# Patient Record
Sex: Female | Born: 1970 | Race: White | Hispanic: No | Marital: Married | Smoking: Former smoker
Health system: Southern US, Community
[De-identification: ages and names within clinical notes are randomized; demographics above are authoritative.]

## PROBLEM LIST (undated history)

## (undated) DIAGNOSIS — K219 Gastro-esophageal reflux disease without esophagitis: Secondary | ICD-10-CM

## (undated) DIAGNOSIS — N8500 Endometrial hyperplasia, unspecified: Secondary | ICD-10-CM

## (undated) DIAGNOSIS — F410 Panic disorder [episodic paroxysmal anxiety] without agoraphobia: Secondary | ICD-10-CM

## (undated) DIAGNOSIS — J189 Pneumonia, unspecified organism: Secondary | ICD-10-CM

## (undated) DIAGNOSIS — R0602 Shortness of breath: Secondary | ICD-10-CM

## (undated) DIAGNOSIS — K432 Incisional hernia without obstruction or gangrene: Secondary | ICD-10-CM

## (undated) HISTORY — DX: Morbid (severe) obesity due to excess calories: E66.01

## (undated) HISTORY — DX: Panic disorder (episodic paroxysmal anxiety): F41.0

## (undated) HISTORY — PX: OOPHORECTOMY: SHX86

## (undated) HISTORY — PX: LAPAROSCOPIC CHOLECYSTECTOMY: SUR755

## (undated) HISTORY — PX: HERNIA REPAIR: SHX51

## (undated) HISTORY — DX: Endometrial hyperplasia, unspecified: N85.00

---
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
## 1997-07-20 ENCOUNTER — Encounter: Admission: RE | Admit: 1997-07-20 | Discharge: 1997-07-20 | Payer: Self-pay | Admitting: Family Medicine

## 1997-08-11 ENCOUNTER — Emergency Department (HOSPITAL_COMMUNITY): Admission: EM | Admit: 1997-08-11 | Discharge: 1997-08-11 | Payer: Self-pay | Admitting: Emergency Medicine

## 1997-08-15 ENCOUNTER — Emergency Department (HOSPITAL_COMMUNITY): Admission: EM | Admit: 1997-08-15 | Discharge: 1997-08-15 | Payer: Self-pay | Admitting: Emergency Medicine

## 1997-09-11 ENCOUNTER — Emergency Department (HOSPITAL_COMMUNITY): Admission: EM | Admit: 1997-09-11 | Discharge: 1997-09-11 | Payer: Self-pay | Admitting: Emergency Medicine

## 1997-09-17 ENCOUNTER — Encounter: Admission: RE | Admit: 1997-09-17 | Discharge: 1997-09-17 | Payer: Self-pay | Admitting: Family Medicine

## 1997-10-01 ENCOUNTER — Encounter: Admission: RE | Admit: 1997-10-01 | Discharge: 1997-10-01 | Payer: Self-pay | Admitting: Sports Medicine

## 1998-05-27 ENCOUNTER — Emergency Department (HOSPITAL_COMMUNITY): Admission: EM | Admit: 1998-05-27 | Discharge: 1998-05-27 | Payer: Self-pay | Admitting: Emergency Medicine

## 1998-05-28 ENCOUNTER — Encounter: Payer: Self-pay | Admitting: Emergency Medicine

## 1998-06-22 ENCOUNTER — Encounter: Payer: Self-pay | Admitting: Emergency Medicine

## 1998-06-22 ENCOUNTER — Encounter: Payer: Self-pay | Admitting: General Surgery

## 1998-06-22 ENCOUNTER — Inpatient Hospital Stay (HOSPITAL_COMMUNITY): Admission: EM | Admit: 1998-06-22 | Discharge: 1998-06-23 | Payer: Self-pay | Admitting: Emergency Medicine

## 1998-09-09 ENCOUNTER — Emergency Department (HOSPITAL_COMMUNITY): Admission: EM | Admit: 1998-09-09 | Discharge: 1998-09-09 | Payer: Self-pay | Admitting: Emergency Medicine

## 1999-03-17 ENCOUNTER — Emergency Department (HOSPITAL_COMMUNITY): Admission: EM | Admit: 1999-03-17 | Discharge: 1999-03-17 | Payer: Self-pay | Admitting: Emergency Medicine

## 1999-06-12 ENCOUNTER — Emergency Department (HOSPITAL_COMMUNITY): Admission: EM | Admit: 1999-06-12 | Discharge: 1999-06-12 | Payer: Self-pay | Admitting: Emergency Medicine

## 1999-06-16 ENCOUNTER — Other Ambulatory Visit: Admission: RE | Admit: 1999-06-16 | Discharge: 1999-06-16 | Payer: Self-pay | Admitting: Internal Medicine

## 2000-01-27 ENCOUNTER — Other Ambulatory Visit: Admission: RE | Admit: 2000-01-27 | Discharge: 2000-01-27 | Payer: Self-pay | Admitting: Obstetrics & Gynecology

## 2000-07-31 ENCOUNTER — Emergency Department (HOSPITAL_COMMUNITY): Admission: EM | Admit: 2000-07-31 | Discharge: 2000-07-31 | Payer: Self-pay | Admitting: Emergency Medicine

## 2000-09-07 ENCOUNTER — Emergency Department (HOSPITAL_COMMUNITY): Admission: EM | Admit: 2000-09-07 | Discharge: 2000-09-07 | Payer: Self-pay

## 2000-09-17 ENCOUNTER — Encounter: Admission: RE | Admit: 2000-09-17 | Discharge: 2000-09-17 | Payer: Self-pay | Admitting: Family Medicine

## 2000-10-05 ENCOUNTER — Encounter: Admission: RE | Admit: 2000-10-05 | Discharge: 2000-10-05 | Payer: Self-pay | Admitting: Family Medicine

## 2000-10-15 ENCOUNTER — Encounter (INDEPENDENT_AMBULATORY_CARE_PROVIDER_SITE_OTHER): Payer: Self-pay | Admitting: *Deleted

## 2000-10-19 ENCOUNTER — Encounter: Admission: RE | Admit: 2000-10-19 | Discharge: 2000-10-19 | Payer: Self-pay | Admitting: Family Medicine

## 2000-11-10 ENCOUNTER — Emergency Department (HOSPITAL_COMMUNITY): Admission: EM | Admit: 2000-11-10 | Discharge: 2000-11-10 | Payer: Self-pay | Admitting: Emergency Medicine

## 2000-11-19 ENCOUNTER — Encounter: Admission: RE | Admit: 2000-11-19 | Discharge: 2000-11-19 | Payer: Self-pay | Admitting: Family Medicine

## 2000-11-24 ENCOUNTER — Emergency Department (HOSPITAL_COMMUNITY): Admission: EM | Admit: 2000-11-24 | Discharge: 2000-11-24 | Payer: Self-pay | Admitting: Emergency Medicine

## 2000-11-24 ENCOUNTER — Encounter: Payer: Self-pay | Admitting: Emergency Medicine

## 2000-12-24 ENCOUNTER — Encounter: Admission: RE | Admit: 2000-12-24 | Discharge: 2000-12-24 | Payer: Self-pay | Admitting: Family Medicine

## 2000-12-31 ENCOUNTER — Ambulatory Visit (HOSPITAL_COMMUNITY): Admission: RE | Admit: 2000-12-31 | Discharge: 2000-12-31 | Payer: Self-pay

## 2001-01-07 ENCOUNTER — Encounter: Admission: RE | Admit: 2001-01-07 | Discharge: 2001-01-07 | Payer: Self-pay | Admitting: Family Medicine

## 2001-01-23 ENCOUNTER — Encounter: Admission: RE | Admit: 2001-01-23 | Discharge: 2001-01-23 | Payer: Self-pay | Admitting: Family Medicine

## 2001-02-01 ENCOUNTER — Ambulatory Visit (HOSPITAL_COMMUNITY): Admission: RE | Admit: 2001-02-01 | Discharge: 2001-02-01 | Payer: Self-pay | Admitting: Obstetrics

## 2001-02-06 ENCOUNTER — Encounter: Admission: RE | Admit: 2001-02-06 | Discharge: 2001-02-06 | Payer: Self-pay | Admitting: Family Medicine

## 2001-02-11 ENCOUNTER — Encounter: Admission: RE | Admit: 2001-02-11 | Discharge: 2001-02-11 | Payer: Self-pay | Admitting: Family Medicine

## 2001-02-21 ENCOUNTER — Encounter: Admission: RE | Admit: 2001-02-21 | Discharge: 2001-02-21 | Payer: Self-pay | Admitting: Family Medicine

## 2001-02-25 ENCOUNTER — Encounter: Admission: RE | Admit: 2001-02-25 | Discharge: 2001-02-25 | Payer: Self-pay | Admitting: Family Medicine

## 2001-03-02 ENCOUNTER — Emergency Department (HOSPITAL_COMMUNITY): Admission: EM | Admit: 2001-03-02 | Discharge: 2001-03-02 | Payer: Self-pay

## 2001-03-20 ENCOUNTER — Encounter: Admission: RE | Admit: 2001-03-20 | Discharge: 2001-03-20 | Payer: Self-pay | Admitting: Family Medicine

## 2001-03-27 ENCOUNTER — Inpatient Hospital Stay (HOSPITAL_COMMUNITY): Admission: AD | Admit: 2001-03-27 | Discharge: 2001-03-27 | Payer: Self-pay | Admitting: *Deleted

## 2001-03-27 ENCOUNTER — Encounter: Admission: RE | Admit: 2001-03-27 | Discharge: 2001-03-27 | Payer: Self-pay | Admitting: Family Medicine

## 2001-04-01 ENCOUNTER — Encounter: Admission: RE | Admit: 2001-04-01 | Discharge: 2001-04-01 | Payer: Self-pay | Admitting: Family Medicine

## 2001-04-08 ENCOUNTER — Encounter: Admission: RE | Admit: 2001-04-08 | Discharge: 2001-04-08 | Payer: Self-pay | Admitting: Family Medicine

## 2001-04-12 ENCOUNTER — Ambulatory Visit (HOSPITAL_COMMUNITY): Admission: RE | Admit: 2001-04-12 | Discharge: 2001-04-12 | Payer: Self-pay

## 2001-04-17 HISTORY — PX: LAPAROSCOPIC TUBAL LIGATION: SHX1937

## 2001-04-19 ENCOUNTER — Encounter: Admission: RE | Admit: 2001-04-19 | Discharge: 2001-04-19 | Payer: Self-pay | Admitting: Family Medicine

## 2001-04-19 ENCOUNTER — Inpatient Hospital Stay: Admission: AD | Admit: 2001-04-19 | Discharge: 2001-04-19 | Payer: Self-pay | Admitting: Obstetrics

## 2001-04-21 ENCOUNTER — Inpatient Hospital Stay (HOSPITAL_COMMUNITY): Admission: AD | Admit: 2001-04-21 | Discharge: 2001-04-21 | Payer: Self-pay | Admitting: Obstetrics & Gynecology

## 2001-04-28 ENCOUNTER — Inpatient Hospital Stay (HOSPITAL_COMMUNITY): Admission: AD | Admit: 2001-04-28 | Discharge: 2001-04-28 | Payer: Self-pay | Admitting: Obstetrics & Gynecology

## 2001-04-29 ENCOUNTER — Encounter: Admission: RE | Admit: 2001-04-29 | Discharge: 2001-04-29 | Payer: Self-pay | Admitting: Family Medicine

## 2001-05-07 ENCOUNTER — Inpatient Hospital Stay (HOSPITAL_COMMUNITY): Admission: AD | Admit: 2001-05-07 | Discharge: 2001-05-07 | Payer: Self-pay | Admitting: *Deleted

## 2001-05-13 ENCOUNTER — Inpatient Hospital Stay (HOSPITAL_COMMUNITY): Admission: AD | Admit: 2001-05-13 | Discharge: 2001-05-17 | Payer: Self-pay | Admitting: Obstetrics & Gynecology

## 2001-05-13 ENCOUNTER — Encounter (INDEPENDENT_AMBULATORY_CARE_PROVIDER_SITE_OTHER): Payer: Self-pay | Admitting: Specialist

## 2001-05-13 ENCOUNTER — Inpatient Hospital Stay (HOSPITAL_COMMUNITY): Admission: AD | Admit: 2001-05-13 | Discharge: 2001-05-13 | Payer: Self-pay | Admitting: Obstetrics & Gynecology

## 2001-05-21 ENCOUNTER — Inpatient Hospital Stay (HOSPITAL_COMMUNITY): Admission: AD | Admit: 2001-05-21 | Discharge: 2001-05-21 | Payer: Self-pay | Admitting: *Deleted

## 2001-06-12 ENCOUNTER — Encounter: Admission: RE | Admit: 2001-06-12 | Discharge: 2001-06-12 | Payer: Self-pay | Admitting: Family Medicine

## 2001-09-13 ENCOUNTER — Ambulatory Visit (HOSPITAL_COMMUNITY): Admission: RE | Admit: 2001-09-13 | Discharge: 2001-09-13 | Payer: Self-pay | Admitting: Obstetrics & Gynecology

## 2001-11-11 ENCOUNTER — Encounter: Admission: RE | Admit: 2001-11-11 | Discharge: 2001-11-11 | Payer: Self-pay | Admitting: Family Medicine

## 2002-02-27 ENCOUNTER — Encounter: Admission: RE | Admit: 2002-02-27 | Discharge: 2002-02-27 | Payer: Self-pay | Admitting: Family Medicine

## 2002-03-03 ENCOUNTER — Encounter: Admission: RE | Admit: 2002-03-03 | Discharge: 2002-03-03 | Payer: Self-pay | Admitting: Family Medicine

## 2002-03-06 ENCOUNTER — Encounter: Admission: RE | Admit: 2002-03-06 | Discharge: 2002-03-06 | Payer: Self-pay | Admitting: Family Medicine

## 2002-03-17 ENCOUNTER — Encounter: Admission: RE | Admit: 2002-03-17 | Discharge: 2002-03-17 | Payer: Self-pay | Admitting: Family Medicine

## 2002-03-31 ENCOUNTER — Encounter: Admission: RE | Admit: 2002-03-31 | Discharge: 2002-03-31 | Payer: Self-pay | Admitting: Family Medicine

## 2002-05-01 ENCOUNTER — Encounter: Admission: RE | Admit: 2002-05-01 | Discharge: 2002-05-01 | Payer: Self-pay | Admitting: Family Medicine

## 2002-07-04 ENCOUNTER — Encounter: Admission: RE | Admit: 2002-07-04 | Discharge: 2002-07-04 | Payer: Self-pay | Admitting: Family Medicine

## 2002-09-19 ENCOUNTER — Encounter: Admission: RE | Admit: 2002-09-19 | Discharge: 2002-09-19 | Payer: Self-pay | Admitting: Family Medicine

## 2002-11-06 ENCOUNTER — Encounter: Admission: RE | Admit: 2002-11-06 | Discharge: 2002-11-06 | Payer: Self-pay | Admitting: Family Medicine

## 2002-11-28 ENCOUNTER — Encounter: Admission: RE | Admit: 2002-11-28 | Discharge: 2002-11-28 | Payer: Self-pay | Admitting: Family Medicine

## 2002-12-18 ENCOUNTER — Encounter: Admission: RE | Admit: 2002-12-18 | Discharge: 2002-12-18 | Payer: Self-pay | Admitting: Family Medicine

## 2003-01-24 ENCOUNTER — Emergency Department (HOSPITAL_COMMUNITY): Admission: EM | Admit: 2003-01-24 | Discharge: 2003-01-24 | Payer: Self-pay | Admitting: Emergency Medicine

## 2003-02-16 ENCOUNTER — Emergency Department (HOSPITAL_COMMUNITY): Admission: AD | Admit: 2003-02-16 | Discharge: 2003-02-16 | Payer: Self-pay | Admitting: Family Medicine

## 2003-05-01 ENCOUNTER — Encounter: Admission: RE | Admit: 2003-05-01 | Discharge: 2003-05-01 | Payer: Self-pay | Admitting: Family Medicine

## 2003-05-07 ENCOUNTER — Encounter: Admission: RE | Admit: 2003-05-07 | Discharge: 2003-05-07 | Payer: Self-pay | Admitting: Family Medicine

## 2003-07-21 ENCOUNTER — Encounter: Admission: RE | Admit: 2003-07-21 | Discharge: 2003-07-21 | Payer: Self-pay | Admitting: Family Medicine

## 2003-08-15 ENCOUNTER — Inpatient Hospital Stay (HOSPITAL_COMMUNITY): Admission: AD | Admit: 2003-08-15 | Discharge: 2003-08-15 | Payer: Self-pay | Admitting: Family Medicine

## 2003-08-17 ENCOUNTER — Encounter: Admission: RE | Admit: 2003-08-17 | Discharge: 2003-08-17 | Payer: Self-pay | Admitting: Family Medicine

## 2003-08-21 ENCOUNTER — Emergency Department (HOSPITAL_COMMUNITY): Admission: EM | Admit: 2003-08-21 | Discharge: 2003-08-21 | Payer: Self-pay | Admitting: Emergency Medicine

## 2003-08-24 ENCOUNTER — Encounter: Admission: RE | Admit: 2003-08-24 | Discharge: 2003-08-24 | Payer: Self-pay | Admitting: Family Medicine

## 2003-08-25 ENCOUNTER — Encounter: Admission: RE | Admit: 2003-08-25 | Discharge: 2003-08-25 | Payer: Self-pay | Admitting: Family Medicine

## 2003-09-03 ENCOUNTER — Encounter: Admission: RE | Admit: 2003-09-03 | Discharge: 2003-09-03 | Payer: Self-pay | Admitting: Family Medicine

## 2003-09-08 ENCOUNTER — Encounter: Admission: RE | Admit: 2003-09-08 | Discharge: 2003-09-25 | Payer: Self-pay | Admitting: Family Medicine

## 2003-11-24 ENCOUNTER — Emergency Department (HOSPITAL_COMMUNITY): Admission: EM | Admit: 2003-11-24 | Discharge: 2003-11-24 | Payer: Self-pay | Admitting: Emergency Medicine

## 2004-01-25 ENCOUNTER — Ambulatory Visit: Payer: Self-pay | Admitting: Family Medicine

## 2004-03-14 ENCOUNTER — Ambulatory Visit: Payer: Self-pay | Admitting: Family Medicine

## 2004-04-25 ENCOUNTER — Ambulatory Visit (HOSPITAL_COMMUNITY): Admission: RE | Admit: 2004-04-25 | Discharge: 2004-04-25 | Payer: Self-pay | Admitting: Family Medicine

## 2004-04-25 ENCOUNTER — Emergency Department (HOSPITAL_COMMUNITY): Admission: EM | Admit: 2004-04-25 | Discharge: 2004-04-25 | Payer: Self-pay | Admitting: Family Medicine

## 2004-05-05 ENCOUNTER — Ambulatory Visit: Payer: Self-pay | Admitting: Family Medicine

## 2004-11-14 ENCOUNTER — Ambulatory Visit (HOSPITAL_COMMUNITY): Admission: RE | Admit: 2004-11-14 | Discharge: 2004-11-14 | Payer: Self-pay | Admitting: Family Medicine

## 2004-11-14 ENCOUNTER — Ambulatory Visit: Payer: Self-pay | Admitting: Family Medicine

## 2004-12-12 ENCOUNTER — Ambulatory Visit: Payer: Self-pay | Admitting: Family Medicine

## 2005-02-10 ENCOUNTER — Ambulatory Visit: Payer: Self-pay | Admitting: Family Medicine

## 2005-02-16 ENCOUNTER — Ambulatory Visit: Payer: Self-pay | Admitting: Family Medicine

## 2005-02-28 ENCOUNTER — Ambulatory Visit: Payer: Self-pay | Admitting: Family Medicine

## 2005-03-16 ENCOUNTER — Ambulatory Visit (HOSPITAL_COMMUNITY): Admission: RE | Admit: 2005-03-16 | Discharge: 2005-03-16 | Payer: Self-pay | Admitting: Family Medicine

## 2005-04-03 ENCOUNTER — Ambulatory Visit: Payer: Self-pay | Admitting: Family Medicine

## 2005-08-31 ENCOUNTER — Ambulatory Visit: Payer: Self-pay | Admitting: Family Medicine

## 2005-11-13 ENCOUNTER — Ambulatory Visit: Payer: Self-pay | Admitting: Family Medicine

## 2005-11-14 ENCOUNTER — Ambulatory Visit: Payer: Self-pay | Admitting: Family Medicine

## 2005-11-14 ENCOUNTER — Encounter: Admission: RE | Admit: 2005-11-14 | Discharge: 2005-11-14 | Payer: Self-pay | Admitting: Sports Medicine

## 2005-11-20 ENCOUNTER — Emergency Department (HOSPITAL_COMMUNITY): Admission: EM | Admit: 2005-11-20 | Discharge: 2005-11-20 | Payer: Self-pay | Admitting: Family Medicine

## 2005-12-04 ENCOUNTER — Ambulatory Visit: Payer: Self-pay | Admitting: Family Medicine

## 2006-02-28 ENCOUNTER — Emergency Department (HOSPITAL_COMMUNITY): Admission: EM | Admit: 2006-02-28 | Discharge: 2006-02-28 | Payer: Self-pay | Admitting: Emergency Medicine

## 2006-03-22 ENCOUNTER — Ambulatory Visit: Payer: Self-pay | Admitting: Family Medicine

## 2006-06-14 DIAGNOSIS — F410 Panic disorder [episodic paroxysmal anxiety] without agoraphobia: Secondary | ICD-10-CM | POA: Insufficient documentation

## 2006-06-14 DIAGNOSIS — E669 Obesity, unspecified: Secondary | ICD-10-CM

## 2006-06-14 DIAGNOSIS — E66813 Obesity, class 3: Secondary | ICD-10-CM

## 2006-06-14 HISTORY — DX: Panic disorder (episodic paroxysmal anxiety): F41.0

## 2006-06-14 HISTORY — DX: Morbid (severe) obesity due to excess calories: E66.01

## 2006-06-14 HISTORY — DX: Obesity, class 3: E66.813

## 2006-06-15 ENCOUNTER — Encounter (INDEPENDENT_AMBULATORY_CARE_PROVIDER_SITE_OTHER): Payer: Self-pay | Admitting: *Deleted

## 2006-08-29 ENCOUNTER — Emergency Department (HOSPITAL_COMMUNITY): Admission: EM | Admit: 2006-08-29 | Discharge: 2006-08-29 | Payer: Self-pay | Admitting: Emergency Medicine

## 2007-02-25 ENCOUNTER — Encounter: Payer: Self-pay | Admitting: Family Medicine

## 2007-03-11 ENCOUNTER — Ambulatory Visit: Payer: Self-pay | Admitting: Family Medicine

## 2007-03-11 ENCOUNTER — Encounter: Payer: Self-pay | Admitting: Family Medicine

## 2007-03-11 LAB — CONVERTED CEMR LAB
AFP-Tumor Marker: 2.5 ng/mL (ref 0.0–8.0)
ALT: 15 units/L (ref 0–35)
Alkaline Phosphatase: 66 units/L (ref 39–117)
Basophils Absolute: 0 10*3/uL (ref 0.0–0.1)
Basophils Relative: 0 % (ref 0–1)
CO2: 22 meq/L (ref 19–32)
Creatinine, Ser: 0.77 mg/dL (ref 0.40–1.20)
Eosinophils Absolute: 0.2 10*3/uL (ref 0.2–0.7)
Eosinophils Relative: 2 % (ref 0–5)
HCT: 31.8 % — ABNORMAL LOW (ref 36.0–46.0)
Hemoglobin: 9 g/dL — ABNORMAL LOW (ref 12.0–15.0)
LDH: 196 units/L (ref 94–250)
Lymphocytes Relative: 16 % (ref 12–46)
MCHC: 28.3 g/dL — ABNORMAL LOW (ref 30.0–36.0)
MCV: 85 fL (ref 78.0–100.0)
Monocytes Absolute: 0.6 10*3/uL (ref 0.1–1.0)
Platelets: 455 10*3/uL — ABNORMAL HIGH (ref 150–400)
RDW: 15.4 % (ref 11.5–15.5)
Total Bilirubin: 0.4 mg/dL (ref 0.3–1.2)

## 2007-03-12 ENCOUNTER — Ambulatory Visit (HOSPITAL_COMMUNITY): Admission: RE | Admit: 2007-03-12 | Discharge: 2007-03-12 | Payer: Self-pay | Admitting: Family Medicine

## 2007-03-12 ENCOUNTER — Telehealth (INDEPENDENT_AMBULATORY_CARE_PROVIDER_SITE_OTHER): Payer: Self-pay | Admitting: *Deleted

## 2007-03-15 ENCOUNTER — Ambulatory Visit: Admission: RE | Admit: 2007-03-15 | Discharge: 2007-03-15 | Payer: Self-pay | Admitting: Gynecology

## 2007-04-18 HISTORY — PX: ABDOMINAL HYSTERECTOMY: SHX81

## 2007-04-23 ENCOUNTER — Inpatient Hospital Stay (HOSPITAL_COMMUNITY): Admission: RE | Admit: 2007-04-23 | Discharge: 2007-04-26 | Payer: Self-pay | Admitting: Obstetrics & Gynecology

## 2007-04-23 ENCOUNTER — Encounter: Payer: Self-pay | Admitting: Gynecology

## 2007-04-23 ENCOUNTER — Encounter: Payer: Self-pay | Admitting: Family Medicine

## 2007-04-23 DIAGNOSIS — N8500 Endometrial hyperplasia, unspecified: Secondary | ICD-10-CM

## 2007-04-23 HISTORY — DX: Endometrial hyperplasia, unspecified: N85.00

## 2007-04-26 ENCOUNTER — Encounter: Payer: Self-pay | Admitting: Family Medicine

## 2007-05-28 ENCOUNTER — Encounter: Payer: Self-pay | Admitting: Pulmonary Disease

## 2008-02-19 IMAGING — US US EXTREM LOW VENOUS*L*
1 series · 14 of 24 positions shown · non-contrast
Comparison: None.

CLINICAL DATA: 35 year old with left lower leg pain and swelling.  
 LEFT LOWER EXTREMITY VENOUS DOPPLER EXAMINATION:
TECHNIQUE: Gray-scale sonography with compression, as well as color and duplex Doppler ultrasound, were performed to evaluate the deep venous system from the level of the common femoral vein through the popliteal and proximal calf veins.

[Series 1: unknown · 14 of 29 slices shown]
[im 1/29]
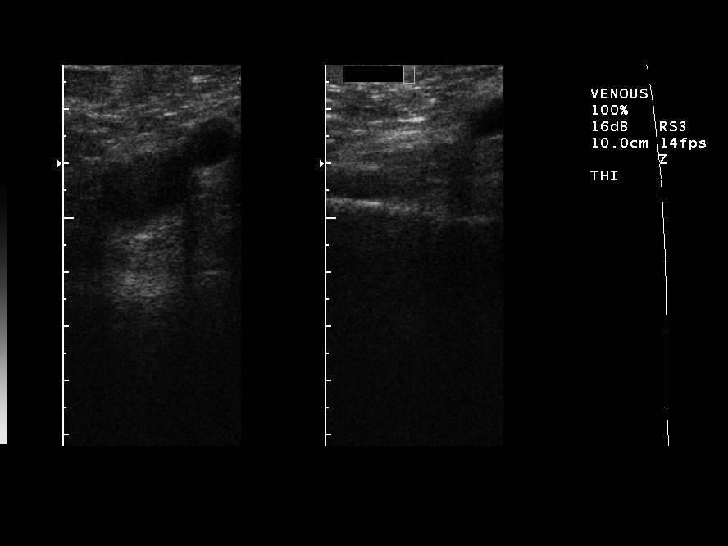
[im 3/29]
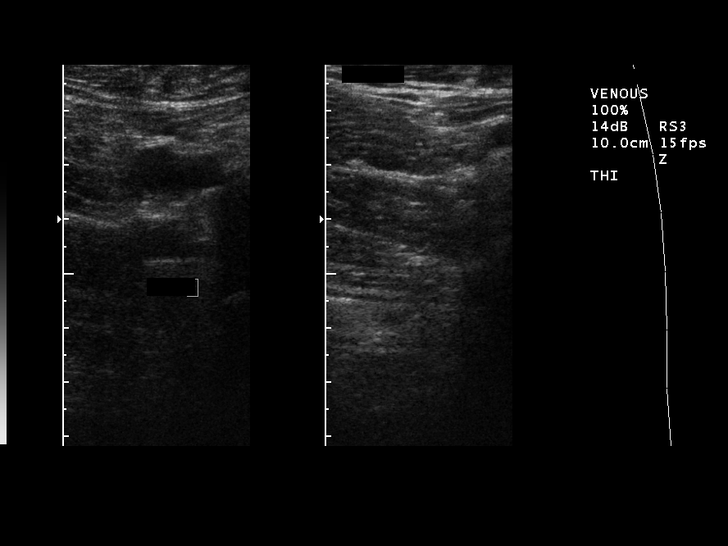
[im 5/29]
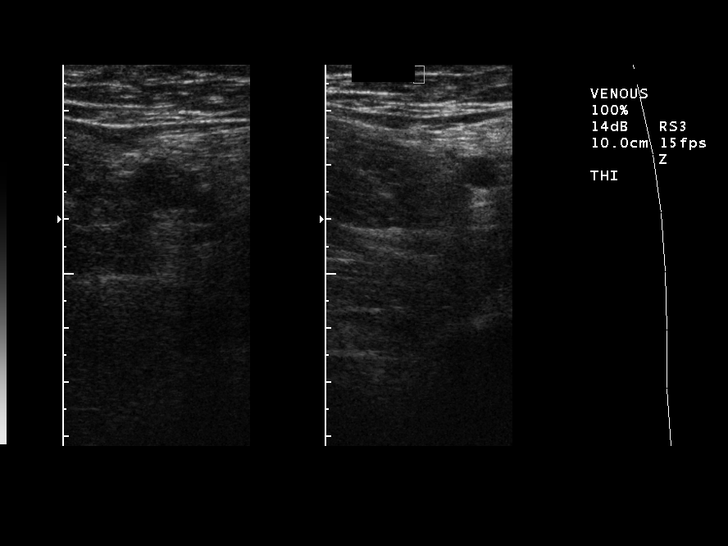
[im 8/29]
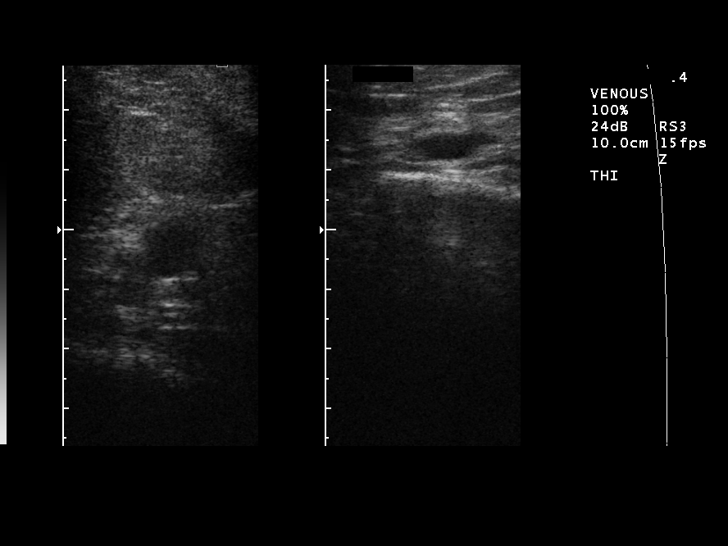
[im 9/29]
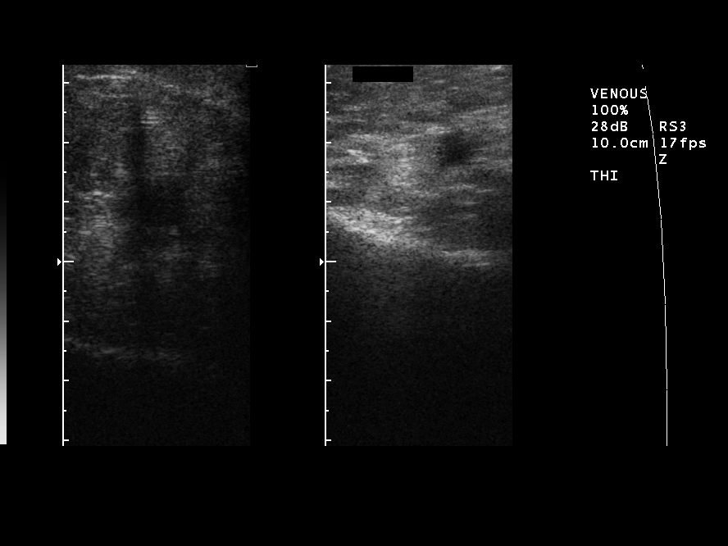
[im 11/29]
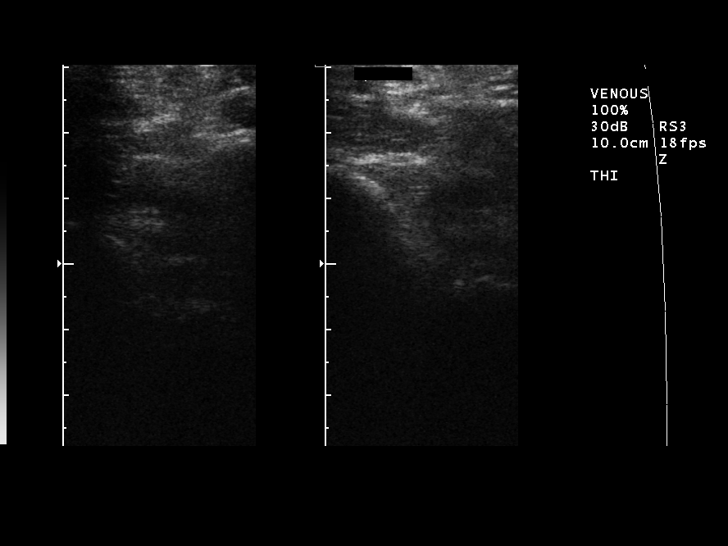
[im 14/29]
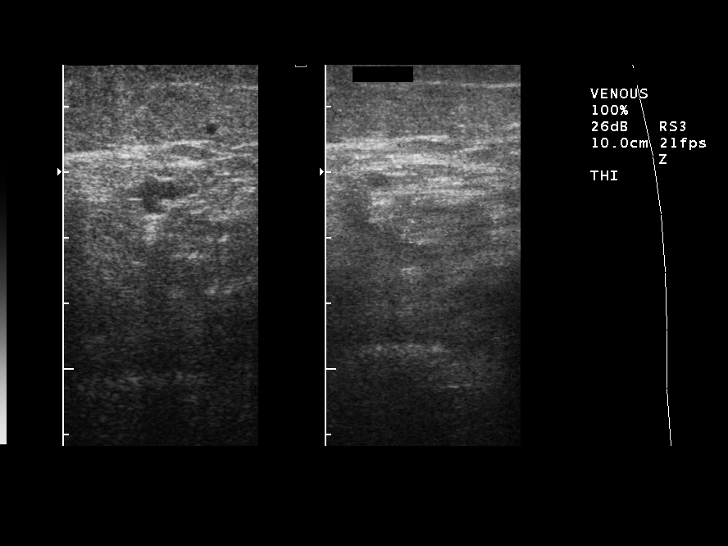
[im 15/29]
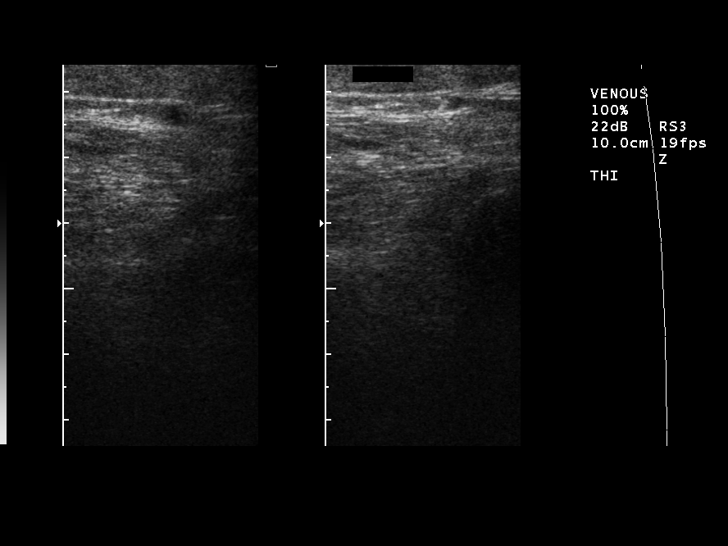
[im 18/29]
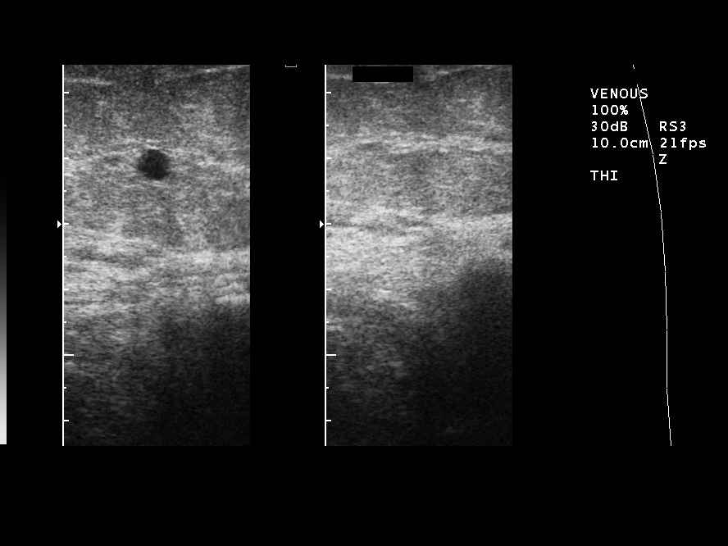
[im 20/29]
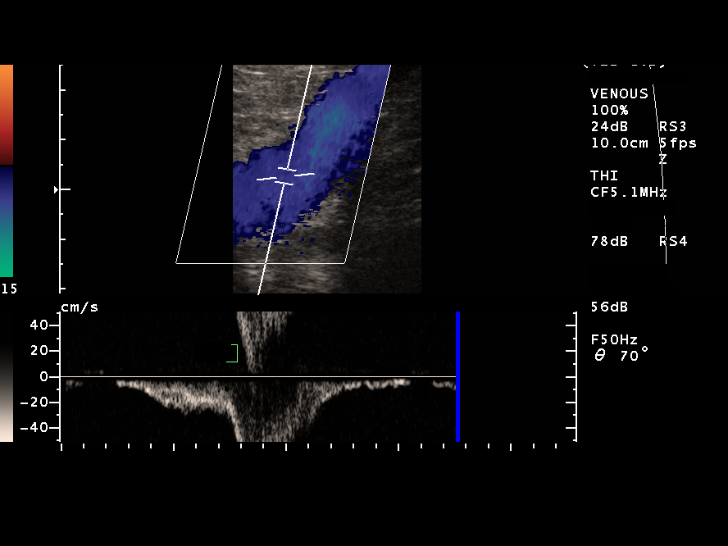
[im 22/29]
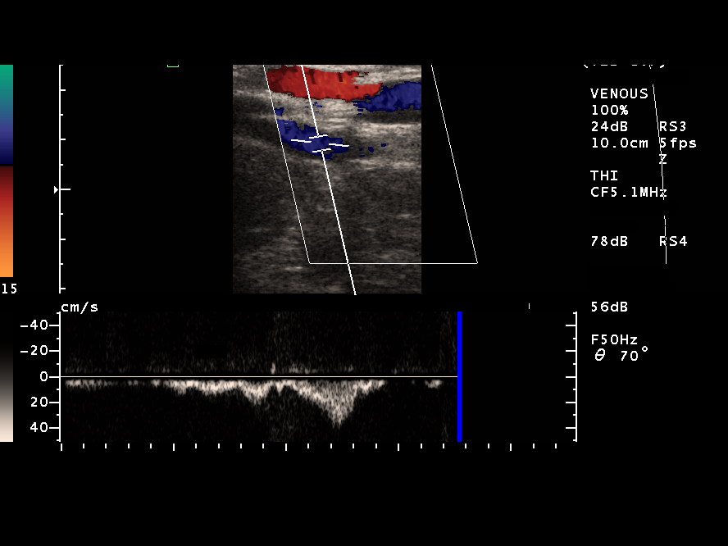
[im 24/29]
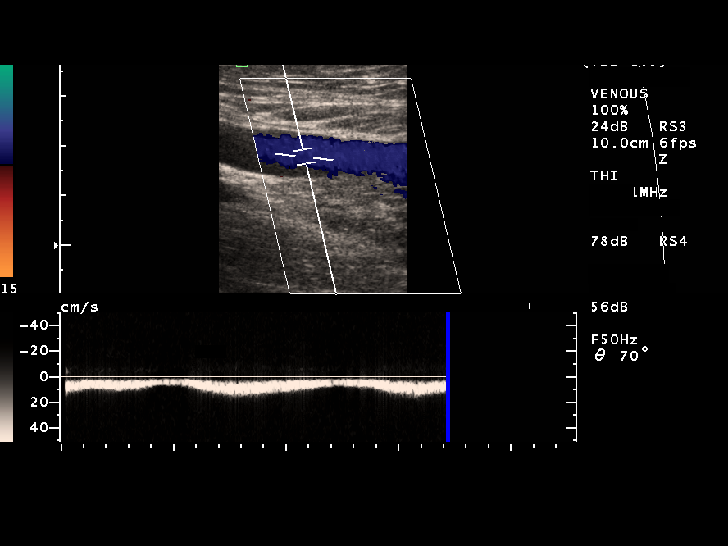
[im 26/29]
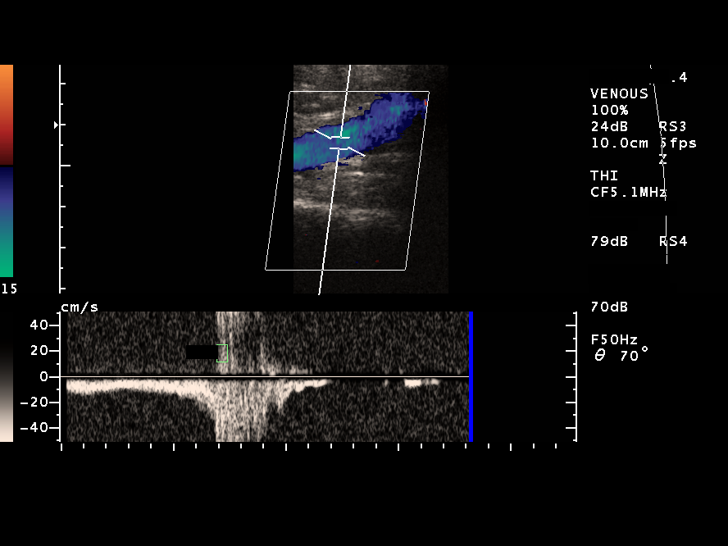
[im 29/29]
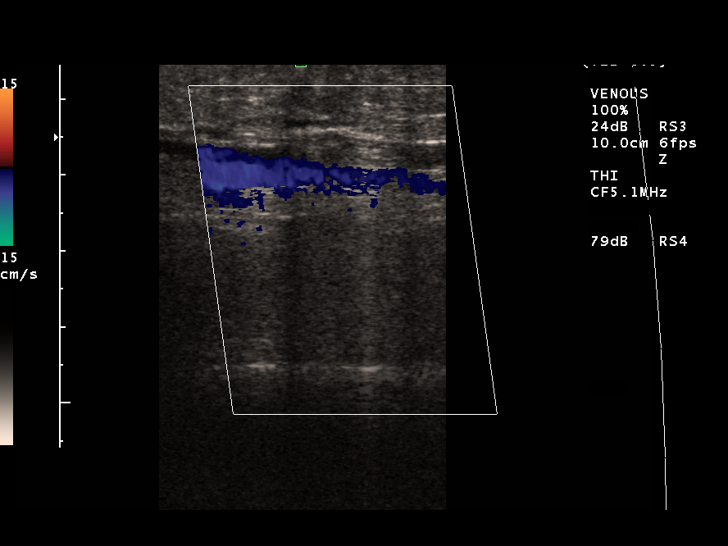

[14 of 24 positions shown; findings below may reference images not displayed]

FINDINGS: There is patent flow in the common femoral, superficial femoral, and popliteal veins.  These vessels were completely compressible and demonstrate increased flow with augmentation.  No reflux was seen.
IMPRESSION: 1.  No evidence for deep venous thrombosis in the left lower extremity.

## 2009-01-03 ENCOUNTER — Emergency Department (HOSPITAL_COMMUNITY): Admission: EM | Admit: 2009-01-03 | Discharge: 2009-01-03 | Payer: Self-pay | Admitting: Emergency Medicine

## 2009-06-16 IMAGING — US US TRANSVAGINAL NON-OB
1 series · 13 of 25 positions shown · non-contrast
Comparison: 04/25/04.

CLINICAL DATA: Followup cystic mass. 
TRANSABDOMINAL AND TRANSVAGINAL PELVIC ULTRASOUND:
TECHNIQUE: Both transabdominal and transvaginal ultrasound examinations of the pelvis were performed including evaluation of the uterus, ovaries, adnexal regions, and pelvic cul-de-sac.

[Series 1: unknown · 0.30mm/px · 13 of 29 slices shown]
[im 1/29]
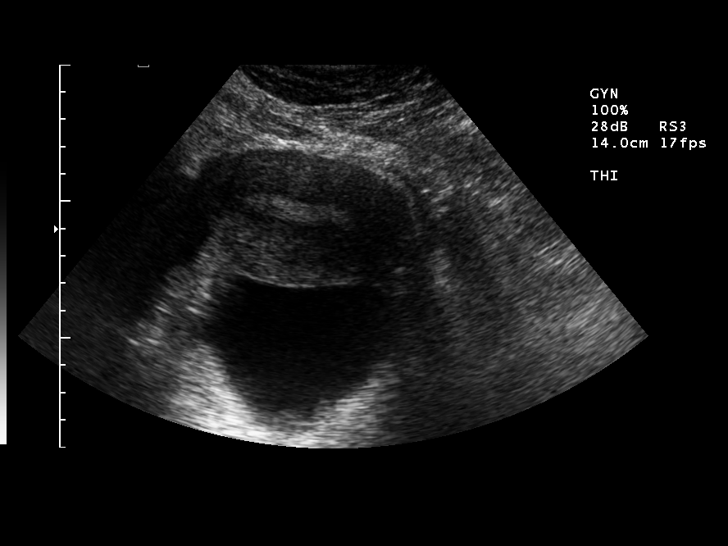
[im 3/29]
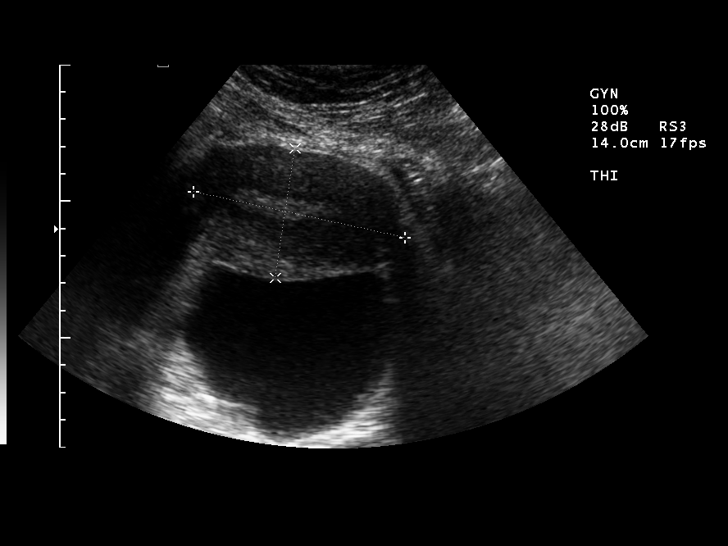
[im 5/29]
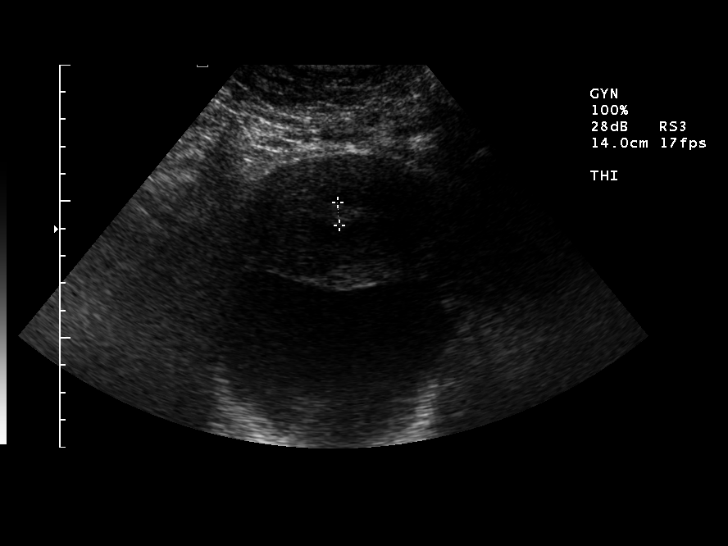
[im 8/29]
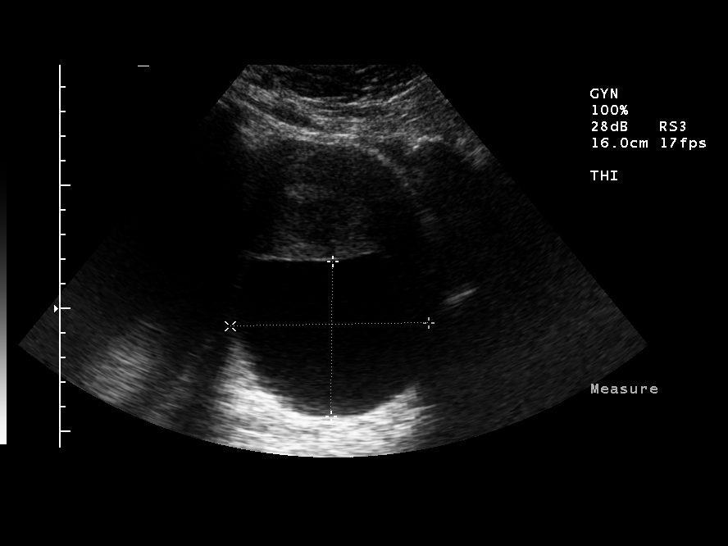
[im 10/29]
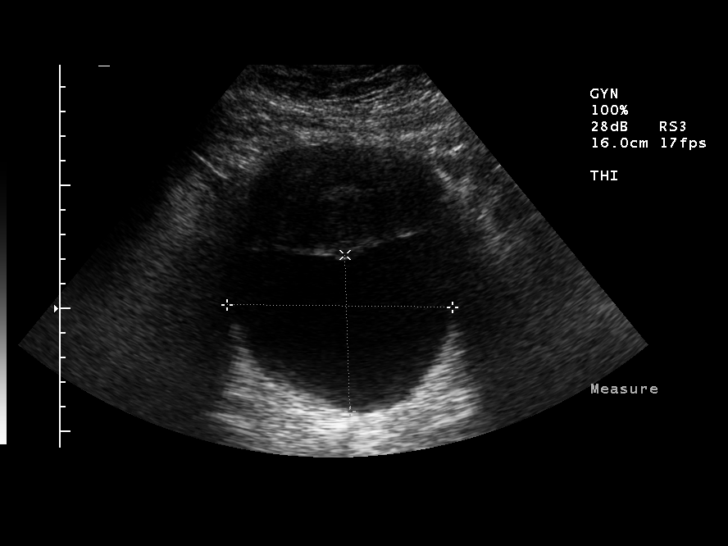
[im 12/29]
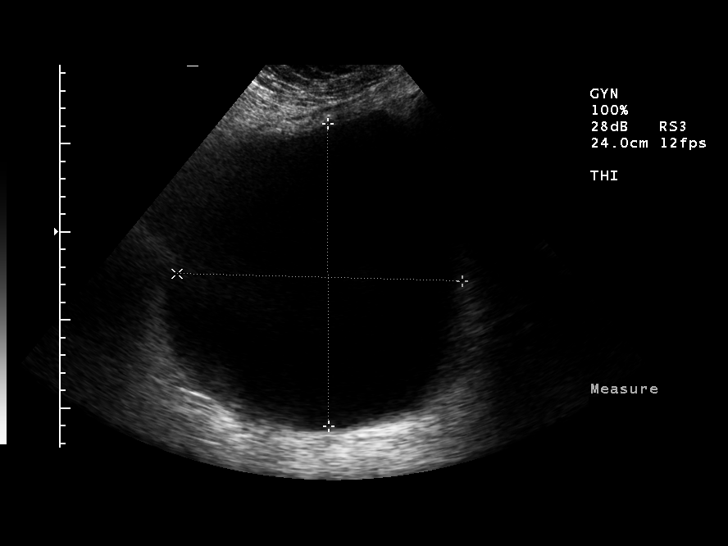
[im 15/29]
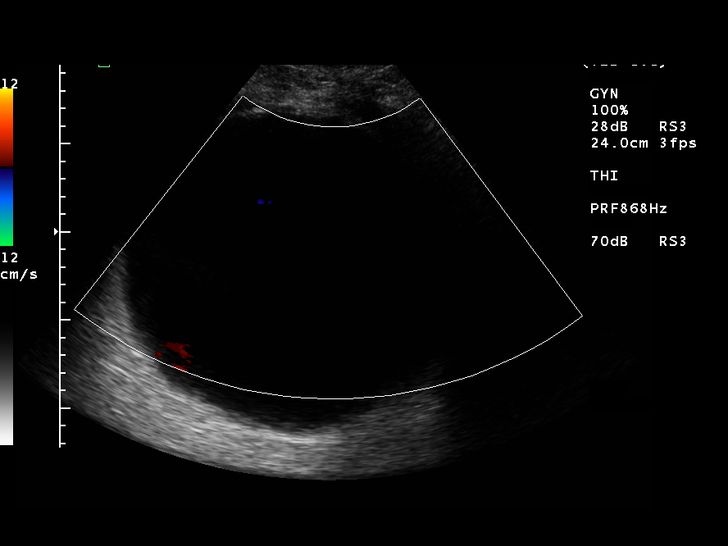
[im 17/29]
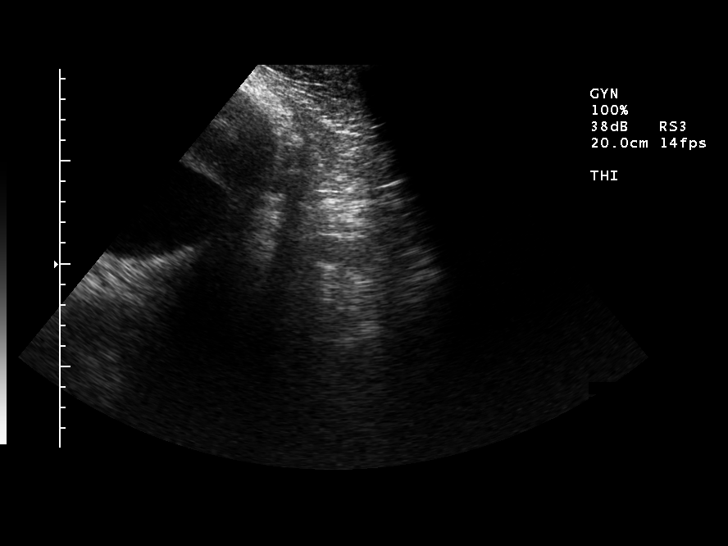
[im 19/29]
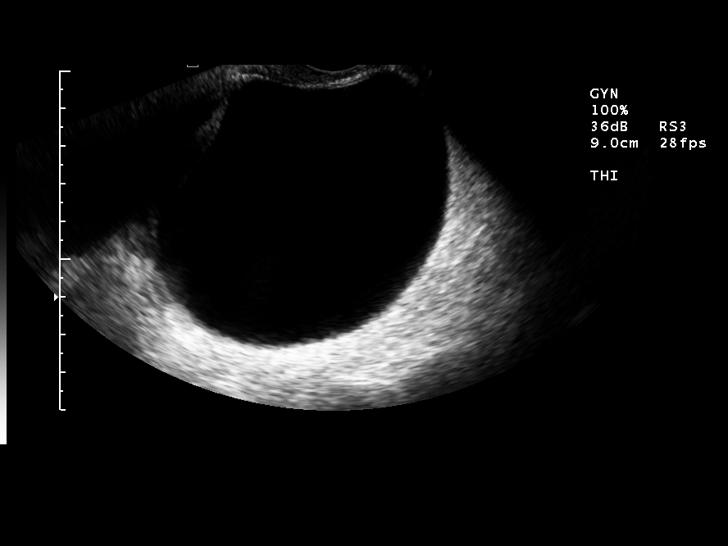
[im 22/29]
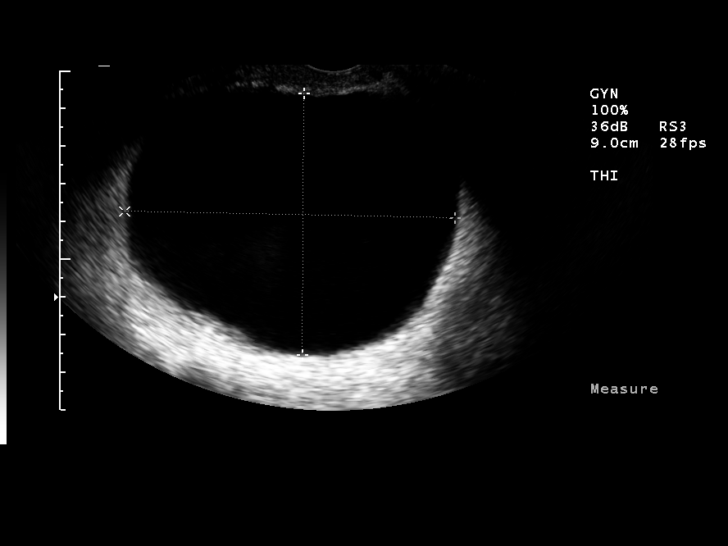
[im 24/29]
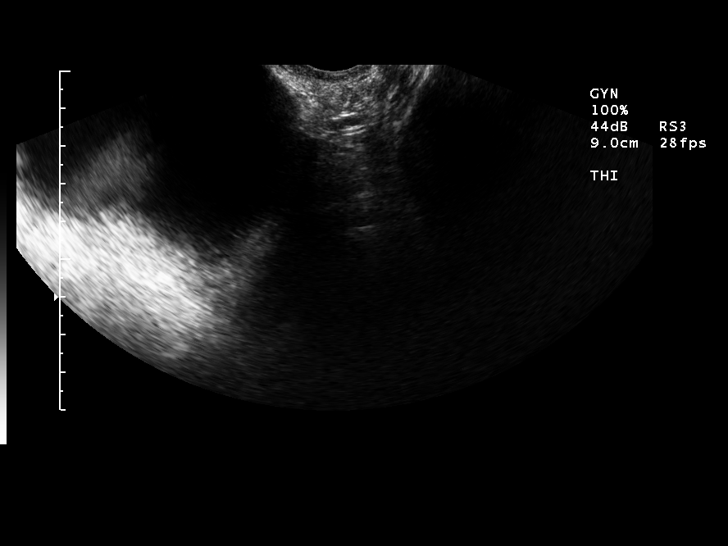
[im 26/29]
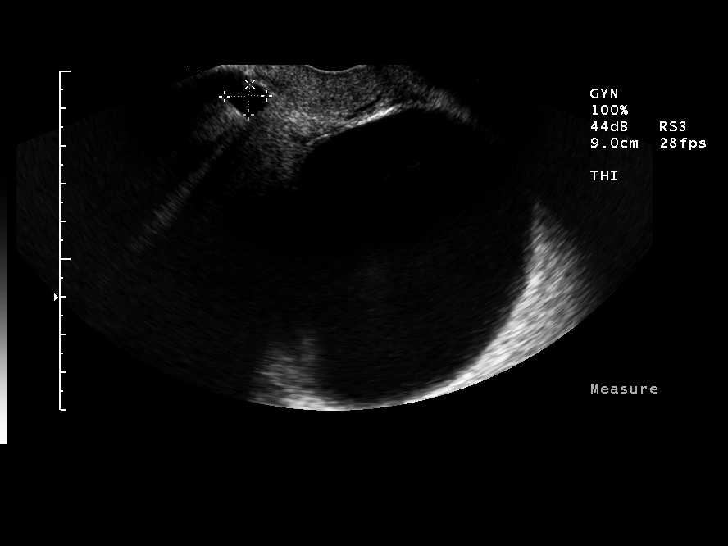
[im 29/29]
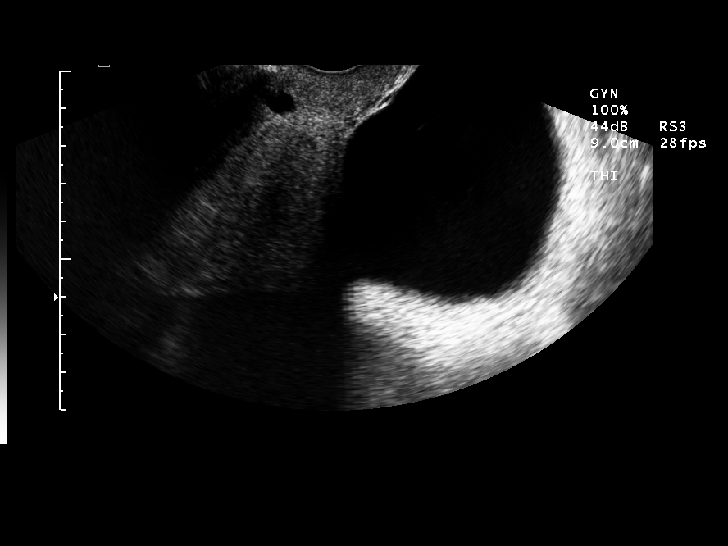

[13 of 25 positions shown; findings below may reference images not displayed]

FINDINGS: The uterus measures 7.9 x 6 x 5.1 cm.  Endometrial lining and thickness measures 0.9 cm.  Small Nabothian cyst.  Posterior and to the right of the uterus is a dominant 20 x 18 x 17.8 cm mass.  Posterior and to the right of the lower uterine segment/cervix is a 9.1 x 8 x 6.4 cm cystic mass.  This represents a progression since the prior examination, when the largest cystic structure measured up to 2.2 cm.  Exact etiology is indeterminate.  This will require followup, as cystic malignancy cannot be excluded.  The ovaries were not visualized as sepearte from the cystic lesions on the present exam.
IMPRESSION: Two large cystic lesions within the pelvis represent a change since the prior exam.  Exact etiology is indeterminate.  The ovaries were not seen separate from the large cystic structures.  Although it is possible these cystic structures are benign in origin, cystic malignancy cannot be excluded and further investigation may be considered.

## 2009-09-10 ENCOUNTER — Encounter: Payer: Self-pay | Admitting: Family Medicine

## 2009-11-29 ENCOUNTER — Ambulatory Visit: Payer: Self-pay | Admitting: Family Medicine

## 2009-11-29 DIAGNOSIS — N951 Menopausal and female climacteric states: Secondary | ICD-10-CM | POA: Insufficient documentation

## 2009-11-29 DIAGNOSIS — R1013 Epigastric pain: Secondary | ICD-10-CM

## 2009-11-29 HISTORY — DX: Menopausal and female climacteric states: N95.1

## 2009-11-29 LAB — CONVERTED CEMR LAB
AST: 15 units/L (ref 0–37)
Albumin: 4.1 g/dL (ref 3.5–5.2)
Alkaline Phosphatase: 54 units/L (ref 39–117)
Chloride: 103 meq/L (ref 96–112)
FSH: 6 milliintl units/mL
LH: 5.9 milliintl units/mL
MCHC: 32.5 g/dL (ref 30.0–36.0)
Potassium: 3.8 meq/L (ref 3.5–5.3)
RBC: 4.6 M/uL (ref 3.87–5.11)
RDW: 13.6 % (ref 11.5–15.5)
Sodium: 140 meq/L (ref 135–145)
Total Protein: 6.9 g/dL (ref 6.0–8.3)

## 2010-05-08 ENCOUNTER — Encounter: Payer: Self-pay | Admitting: Family Medicine

## 2010-05-19 NOTE — Discharge Summary (Signed)
Summary: 1/6 to 04/26/07 Hospital Discharge Summary  NAME:  Jenna Dillon, Jenna Dillon               ACCOUNT NO.:  [id number]      MEDICAL RECORD NO.:  [id number]          PATIENT TYPE:  INP      LOCATION:  1526                         FACILITY:  [facility]      PHYSICIAN:  Roseanna Rainbow, M.D.DATE OF BIRTH:  [birth year]      DATE OF ADMISSION:  04/23/2007   DATE OF DISCHARGE:  04/26/2007                                  DISCHARGE SUMMARY      CHIEF COMPLAINTS:  The patient is a 40 year old Caucasian female who   presents for operative management of bilateral pelvic masses.  Please   see the dictated history and physical for further details.      HOSPITAL COURSE:  The patient was admitted and underwent a total   abdominal hysterectomy and right salpingo-oophorectomy and left   salpingectomy.  Please see the dictated operative summary.  On   postoperative day #1 her hemoglobin was 8.  Preoperatively her   hemoglobin was 10.3.  She was hemodynamically stable.  There is a   question of sleep apnea.  Her primary care provider was contacted.   Further evaluation would be arranged post discharge.  The remainder of   her hospital course was uneventful.  She was discharged to home on   postoperative day #3, tolerating a regular diet.      DISCHARGE DIAGNOSES:   1. Endometriosis with an endometriotic cyst and endometrial complex       hyperplasia with atypia.   2. Possible sleep apnea.   3. Anemia, iron deficiency.      PROCEDURES:  Total abdominal hysterectomy and right salpingo-   oophorectomy and left salpingectomy.      CONDITION:  Stable.      DISCHARGE INSTRUCTIONS:   1. Diet:  Regular.   2. Activity:  Progressive activity, pelvic rest.      DISPOSITION:  The patient was to follow up with Dr. Craige Cotta for evaluation   of sleep apnea.  An appointment was made for February 10 at 9:45.  She   is to follow up at the [facility] on January 15 at 5:00 p.m.               Roseanna Rainbow,  M.D.   Electronically Signed            LAJ/MEDQ  D:  05/28/2007  T:  05/29/2007  Job:  [id number]      cc:   Telford Nab, R.N.   [address]   [place], [place] [address]      Etta Grandchild, M.D.  Observations: Added new observation of PAST SURG HX: Cholecystectomy - 04/18/1999, D&C 1999   EGD Dr.Mann 1995   L.L.E. venous doppler(07/07)- No DVT  PFT w/ & w/o Bronchodilator tx: mild Restriction, no Bronchodilator response - 02/15/2005 Bilateral Tubal Ligation Total Abdominal Hysterectomy for endometriosis (1/09,Clarke-Peason, MD) Right Salpingo-oopherectomy for  endometriotic cyst and endometrial complex       hyperplasia with atypia. (1/09,Clarke-Peason, MD) Left Salpingectomy ( 1/09,Clarke-Peason, MD)   (  04/26/2007 10:15)       Past History:  Past Surgical History: Cholecystectomy - 04/18/1999, D&C 1999   EGD Dr.Mann 1995   L.L.E. venous doppler(07/07)- No DVT  PFT w/ & w/o Bronchodilator tx: mild Restriction, no Bronchodilator response - 02/15/2005 Bilateral Tubal Ligation Total Abdominal Hysterectomy for endometriosis (1/09,Clarke-Peason, MD) Right Salpingo-oopherectomy for  endometriotic cyst and endometrial complex       hyperplasia with atypia. (1/09,Clarke-Peason, MD) Left Salpingectomy ( 1/09,Clarke-Peason, MD)

## 2010-05-19 NOTE — Assessment & Plan Note (Signed)
Summary: pain from surgery site/eo   Vital Signs:  Patient profile:   40 year old female Height:      60.75 inches Weight:      248.5 pounds BMI:     47.51 Temp:     98.4 degrees F oral Pulse rate:   91 / minute BP sitting:   106 / 72  (left arm) Cuff size:   large  Vitals Entered By: Garen Grams LPN (November 29, 2009 1:57 PM)  Nutrition Counseling: Patient's BMI is greater than 25 and therefore counseled on weight management options.  CC: lower abd pain; hot flashes Is Patient Diabetic? No Pain Assessment Patient in pain? no        Primary Care Adyline Huberty:  TODD MCDIARMID MD  CC:  lower abd pain; hot flashes.  History of Present Illness: Abdominal pain ABDOMINAL PAIN Location: just superior to umbilicus in midline Onset: within a month of TAH surgery in 2009, non-progressive. No change in severity or quality or location over time. No radiation of pain.  Does not interfere with daily activity Description: aching, constant, worse at night in bed Not similar to pain that preceded her TAH surgery.  Modifying factors: Can identify nothing that improves pain.  Denies taking OTC or RX analgesics for pain  Stressors: recent, brief psychiatric admission for a what sounds like a transietn mood disturbance that involved anxiety & possibly panic events.  Admission was at [facility].  She felt the disturbance was precipitated by conflictual relations with her husband over her step-daughter's behaviors.  She declined post-hospitalization counseling.  She is on no psychotropic medications from the hospitalization.   She denies feeling sad or lack or pleasure in life.  Her relationship with her husband is going well currently.   Symptoms Nausea/Vomiting: none Diarrhea: none Constipation: none Melena/BRBPR: none Hematemesis: none Anorexia: no Fever/Chills: no Jaundice: no Dysuria: no Back pain: no Rash: no Weight loss: no Vaginal bleeding: No STD exposure:  No LMP: S/P TAH Alcohol use: none NSAID use: none.  No history of blood clots, anemia, cancer, diabetes, heart disease, high cholesterol;, kidney problems, liver problems, stroke, stomach bleeding or ulcers, thyroid disease  PMH Past Surgeries:Cholecystectomy - 04/18/1999; D&C 1999; Bilateral Tubal Ligation; Total Abdominal Hysterectomy for endometriosis (1/09,Clarke-Peason, MD);  Right Salpingo-oopherectomy for  endometriotic cyst and endometrial complex hyperplasia with atypia. Mahalia Longest, MD) Left Salpingectomy ( 1/09,Clarke-Peason, MD) (11/29/2009)    SH: Just received her Medicaid, so is now seeking care for her abdominal pain.   Hot Flashes Onset within a month or two of her TAH and Right Salpingo-oopherectomy Occurs about twice a week, exclusively during sleep where she exoperiences hot face and non-saturating sweat lasting less than 10 minutes. No GI upset, fever, w;eight loss, no alcohol intake, no headache, no dizziness/near syncope.  Patient is taking no medications/OTC/herbals.   Denies vaginal dryness or dyspareunia.  Habits & Providers  Alcohol-Tobacco-Diet     Alcohol drinks/day: 0     Tobacco Status: quit     Tobacco Counseling: not to resume use of tobacco products     Diet Counseling: to improve diet; diet is suboptimal  Exercise-Depression-Behavior     Does Patient Exercise: no     Exercise Counseling: to improve exercise regimen     Have you felt down or hopeless? no     Have you felt little pleasure in things? no     Depression Counseling: not indicated; screening negative for depression     STD Risk: never  Drug Use: never     Seat Belt Use: always     Sun Exposure: rarely  Current Medications (verified): 1)  None  Allergies (verified): 1)  Sulfamethoxazole (Sulfamethoxazole)  Past History:  Past Medical History: Phen-fen receipient Hx of Psychiatric Admission for acute mood distrubance 10/2009. Hx of Panic attacks  Past Surgical  History: Cholecystectomy - 04/18/1999, D&C 1999   C-section in 2003 EGD Dr.Mann 1995   PFT w/ & w/o Bronchodilator tx: mild Restriction, no Bronchodilator response - 02/15/2005 Bilateral Tubal Ligation Total Abdominal Hysterectomy for endometriosis (1/09,Clarke-Peason, MD) Right Salpingo-oopherectomy for  endometriotic cyst and endometrial complex       hyperplasia with atypia. (1/09,Clarke-Peason, MD) Left Salpingectomy ( 1/09,Clarke-Peason, MD)   Family History: Adopted. Biological daughter with Fragile X syndrome  Social History: Married, Heterosexual No tobacco, No alcohol,  No illicit drug use.  2 children: one biological and one stepchild. High School graduate Unemployed, Outdoor pets Owns a car Owns a mobile home. Enjoys swimming and going to [employer]): [phone] Risk:  never Drug Use:  never Seat Belt Use:  always Sun Exposure-Excessive:  rarely Does Patient Exercise:  no  Physical Exam  General:  alert.  Morbid obesity. No acute distress.  Lungs:  normal respiratory effort, no accessory muscle use, and normal breath sounds.   Heart:  normal rate, regular rhythm, no murmur, no gallop, and no rub.   Abdomen:  left paramedian surgical scar. circumscribed bulge in abdominal wall with valsalva just above umbilicus in midline. Bulge approx. 5-7 cm in diameter and elevates approx 2 cm above level of remaining abdominal wall surface.  Possible palpable defect in area of bulge noted above. The area is tender to palpation and is the area of chronic pain noted by patient.  (+) Bowel sounds no distention, no guarding, no rigidity, no hepatomegaly, and no splenomegaly.   Extremities:  trace left pedal edema and trace right pedal edema.   Psych:  memory intact for recent and remote, normally interactive, good eye contact, not anxious appearing, and not depressed appearing.  Groomed. Speech is syntactically structured, semantically meaninful and pragmatically  directed.    Impression & Recommendations:  Problem # 1:  ABDOMINAL PAIN, EPIGASTRIC (ICD-789.06) Working suspicion is a ventral abdominal wall hernia possibly related to prior surgery incision for TAH.  Will check Abdominal CT for evidence of abdominal wall deficit.  If present, will discuss with GYN who did surgery as to whether they wish to assess the patient or if referral to general surgery is appropriate.  Orders: Comp Met-[facility] [phone]) CBC-[facility] (18841) CT with Contrast (CT w/ contrast) [facility]- Est  Level 4 (66063)  Problem # 2:  HOT FLASHES (ICD-627.2) Working suspicion is of vasomotor instability in patient with one remaining ovary who has undergone TAH.  Her FSH and LH are not consistent with perimenopausal or menopausal status.  Will discuss options of low-dose OCP or SSRI or Gabapentin on next visit.  Orders: Comp Met-[facility] [phone]) CBC-[facility] (55732) FSH-[facility] [phone]) LH-[facility] ([phone]) [facility]- Est  Level 4 (61607)  Problem # 3:  Preventive Health Care (ICD-V70.0) Needs Tdap next visit  Problem # 4:  Screening Breast Cancer (ICD-V76.10) Gave information for [facility] with instructions to contact for screening mammogram this year.   Patient Instructions: 1)  Please schedule a follow-up appointment in 2 to 3  weeks. May double book.  2)  Go for Abdominal CAT scan to look for abdominal wall hernia causing your pain. 3)  If  there is a hernia, Dr McDiarmid will contact the GYN surgeons who did your surgery to see if they want to see you aboutit or if you should go see a Development worker, international aid.  4)  Dr McDiarmid will contact you about the blood test results for menopause ([facility]).

## 2010-05-19 NOTE — Op Note (Signed)
Summary: TAH, R. Salpingoopherectomy, L. Salpingectomy  NAME:  Jenna Dillon, Jenna Dillon               ACCOUNT NO.:  [id number]      MEDICAL RECORD NO.:  [id number]          PATIENT TYPE:  INP      LOCATION:  0004                         FACILITY:  [facility]      PHYSICIAN:  De Blanch, M.D.DATE OF BIRTH:  December 09, 1970      DATE OF PROCEDURE:  04/23/2007   DATE OF DISCHARGE:                                  OPERATIVE REPORT      PREOPERATIVE DIAGNOSIS:  Bilateral complex pelvic masses.      POSTOPERATIVE DIAGNOSES:   1. 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   2. Probable adenomyosis of uterus.      PROCEDURE:  Total abdominal hysterectomy, right salpingo-oophorectomy,   left salpingectomy.      SURGEON:  De Blanch, M.D.      FIRST ASSISTANT:  Antionette Char, M.D.,  Telford Nab, R.N.      ANESTHESIA:  General with orotracheal tube.      SPECIMENS:  Were taken to the pathology laboratory.      ESTIMATED BLOOD LOSS:  200 mL.      SURGICAL FINDINGS:  At the time of exploratory laparotomy, the patient   had approximately 15-cm cystic mass arising from the right ovary.  This   was inflamed and adherent to the omentum and anterior abdominal wall   peritoneum as well as the posterior cul-de-sac and sigmoid colon.  The   left ovary appeared normal, although there was approximately an 8-cm   paratubal cyst rising from the left fallopian tube.  The uterus was   approximately 8 weeks' gestational size.  Exploration of the upper   abdomen including the diaphragm, liver, spleen, stomach, omentum, small   and large bowel and appendix were all normal.      PROCEDURE:  The patient was brought to the operating room and, after   satisfactory attainment of general anesthesia, was placed in modified   lithotomy position in Dilley stirrups.  The anterior abdominal wall,   perineum and vagina were prepped with Betadine.  A Foley  catheter was   inserted and the patient was draped.  The abdomen was entered through a   left paramedian incision.  Peritoneal washings were obtained and sent to   cytopathology.  Adhesions of the omentum to the large mass were lysed   with sharp and blunt dissection.  Hemostasis achieved with cautery.   There was an extensive amount of adhesions, but ultimately, there were   freed from the mass and the mass mobilized into the operative field.  It   was noted that the mass arose from the pedicle which had undergone   torsion.  Two parametrial clamps were placed across this pedicle and the   pedicle divided.  The mass including the right tube and ovary were   submitted to frozen section.  Frozen section returned reporting this to   be benign.  The Bookwalter retractor was assembled and the small bowel  packed out of pelvis.  The uterus was grasped with long Kelly clamps and   elevated into the operative field.  The right retroperitoneal space was   opened identifying the ureter.  The ovarian vessels were skeletonized,   clamped, cut, free tied and suture ligated.  The broad ligament was   further opened and the bladder flap developed with sharp and blunt   dissection.  Attention was turned to the left side of the pelvis.  The   paratubal cyst was brought into the operative field.  The patient   desired to preserve her ovary, and therefore the mesosalpinx was   clamped, divided and suture ligated using 2-0 Vicryl.  A large vein was   controlled using hemoclips.  The fallopian tube and paratubal cyst were   fully mobilized and resected.  The ovary was preserved.  The left round   ligament was divided and left retroperitoneal space opened.  The   attachment of the ovary to the uterus (ovarian ligament) was divided   between clamps and suture ligated.  The bladder flap was further   advanced.  The uterine vessels were skeletonized.  The uterine vessels   were clamped, cut and suture ligated in  a stepwise fashion.   Paracervical and cardinal ligaments were clamped, cut and suture   ligated.  Rectovaginal septum was developed to gain further   mobilization.  The bladder was further mobilized, and then the vaginal   angles were crossclamped and divided, vagina transected from its   connection to the cervix.  The uterus and cervix were handed off the   operative field as a separate specimen.  Vaginal angles were transfixed   with 0 Vicryl suture, the central portion of the vagina closed with   interrupted figure-of-eight sutures of 0 Vicryl.  The pelvis was   inspected and found to be hemostatic.  Pelvis was irrigated with saline.   The omentum was then inspected carefully, and hemostasis of bleeding   sites was achieved with cautery.  The appendix was visualized, appeared   to be normal.  The packs and retractors were removed.  The anterior   abdominal wall was closed in layers, the first being a running mass   closure using #1 PDS.  Subcutaneous tissue was irrigated.  Hemostasis   was achieved with cautery.  Because of the patient's obesity, two   subcutaneous retention sutures were placed.  The skin was closed with   skin staples and retention sutures tied over skin bridges.  A dressing   was applied.  The patient was awakened from anesthesia and taken to the   recovery room in satisfactory condition.  Sponge, needle, and instrument   sponge counts correct x2.               De Blanch, M.D.   Electronically Signed            DC/MEDQ  D:  04/23/2007  T:  04/23/2007  Job:  [id number]      cc:   Etta Grandchild, M.D

## 2010-05-19 NOTE — Miscellaneous (Signed)
  Clinical Lists Changes  Medications: Removed medication of PERCOCET 5-325 MG  TABS (OXYCODONE-ACETAMINOPHEN) 1 tablet every 6 hours if needed for abdominal pain Disp:60 tablets

## 2010-08-30 NOTE — Op Note (Signed)
Jenna Dillon, Jenna Dillon               ACCOUNT NO.:  [id number]   MEDICAL RECORD NO.:  [id number]          PATIENT TYPE:  INP   LOCATION:  0004                         FACILITY:  [facility]   PHYSICIAN:  De Blanch, M.D.DATE OF BIRTH:  Aug 22, 1970   DATE OF PROCEDURE:  04/23/2007  DATE OF DISCHARGE:                               OPERATIVE REPORT   PREOPERATIVE DIAGNOSIS:  Bilateral complex pelvic masses.   POSTOPERATIVE DIAGNOSES:  1. 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  2. Probable adenomyosis of uterus.   PROCEDURE:  Total abdominal hysterectomy, right salpingo-oophorectomy,  left salpingectomy.   SURGEON:  De Blanch, M.D.   FIRST ASSISTANT:  Antionette Char, M.D.,  Telford Nab, R.N.   ANESTHESIA:  General with orotracheal tube.   SPECIMENS:  Were taken to the pathology laboratory.   ESTIMATED BLOOD LOSS:  200 mL.   SURGICAL FINDINGS:  At the time of exploratory laparotomy, the patient  had approximately 15-cm cystic mass arising from the right ovary.  This  was inflamed and adherent to the omentum and anterior abdominal wall  peritoneum as well as the posterior cul-de-sac and sigmoid colon.  The  left ovary appeared normal, although there was approximately an 8-cm  paratubal cyst rising from the left fallopian tube.  The uterus was  approximately 8 weeks' gestational size.  Exploration of the upper  abdomen including the diaphragm, liver, spleen, stomach, omentum, small  and large bowel and appendix were all normal.   PROCEDURE:  The patient was brought to the operating room and, after  satisfactory attainment of general anesthesia, was placed in modified  lithotomy position in [place] stirrups.  The anterior abdominal wall,  perineum and vagina were prepped with Betadine.  A Foley catheter was  inserted and the patient was draped.  The abdomen was entered through a  left paramedian incision.   Peritoneal washings were obtained and sent to  cytopathology.  Adhesions of the omentum to the large mass were lysed  with sharp and blunt dissection.  Hemostasis achieved with cautery.  There was an extensive amount of adhesions, but ultimately, there were  freed from the mass and the mass mobilized into the operative field.  It  was noted that the mass arose from the pedicle which had undergone  torsion.  Two parametrial clamps were placed across this pedicle and the  pedicle divided.  The mass including the right tube and ovary were  submitted to frozen section.  Frozen section returned reporting this to  be benign.  The Bookwalter retractor was assembled and the small bowel  packed out of pelvis.  The uterus was grasped with long Kelly clamps and  elevated into the operative field.  The right retroperitoneal space was  opened identifying the ureter.  The ovarian vessels were skeletonized,  clamped, cut, free tied and suture ligated.  The broad ligament was  further opened and the bladder flap developed with sharp and blunt  dissection.  Attention was turned to the left side of the pelvis.  The  paratubal cyst  was brought into the operative field.  The patient  desired to preserve her ovary, and therefore the mesosalpinx was  clamped, divided and suture ligated using 2-0 Vicryl.  A large vein was  controlled using hemoclips.  The fallopian tube and paratubal cyst were  fully mobilized and resected.  The ovary was preserved.  The left round  ligament was divided and left retroperitoneal space opened.  The  attachment of the ovary to the uterus (ovarian ligament) was divided  between clamps and suture ligated.  The bladder flap was further  advanced.  The uterine vessels were skeletonized.  The uterine vessels  were clamped, cut and suture ligated in a stepwise fashion.  Paracervical and cardinal ligaments were clamped, cut and suture  ligated.  Rectovaginal septum was developed to gain  further  mobilization.  The bladder was further mobilized, and then the vaginal  angles were crossclamped and divided, vagina transected from its  connection to the cervix.  The uterus and cervix were handed off the  operative field as a separate specimen.  Vaginal angles were transfixed  with 0 Vicryl suture, the central portion of the vagina closed with  interrupted figure-of-eight sutures of 0 Vicryl.  The pelvis was  inspected and found to be hemostatic.  Pelvis was irrigated with saline.  The omentum was then inspected carefully, and hemostasis of bleeding  sites was achieved with cautery.  The appendix was visualized, appeared  to be normal.  The packs and retractors were removed.  The anterior  abdominal wall was closed in layers, the first being a running mass  closure using #1 PDS.  Subcutaneous tissue was irrigated.  Hemostasis  was achieved with cautery.  Because of the patient's obesity, two  subcutaneous retention sutures were placed.  The skin was closed with  skin staples and retention sutures tied over skin bridges.  A dressing  was applied.  The patient was awakened from anesthesia and taken to the  recovery room in satisfactory condition.  Sponge, needle, and instrument  sponge counts correct x2.      De Blanch, M.D.  Electronically Signed     DC/MEDQ  D:  04/23/2007  T:  04/23/2007  Job:  [id number]   cc:   Leighton Roach McDiarmid, M.D.   Telford Nab, R.N.  [address]  [place], [place] [address]   Roseanna Rainbow, M.D.  Fax: [phone]

## 2010-08-30 NOTE — Consult Note (Signed)
NAMESHAUNETTE, GASSNER               ACCOUNT NO.:  [id number]   MEDICAL RECORD NO.:  [id number]          PATIENT TYPE:  INP   LOCATION:  NA                           FACILITY:  [facility]   PHYSICIAN:  De Blanch, M.D.DATE OF BIRTH:  1971/02/23   DATE OF CONSULTATION:  DATE OF DISCHARGE:                                 CONSULTATION   REFERRING PHYSICIAN:  Leighton Roach McDiarmid, M.D.   CHIEF COMPLAINT:  Pelvic mass.   HISTORY OF PRESENT ILLNESS:  This 40 year old white married female is  admitted for resection of a bilateral pelvic mass.  The patient  initially presented with acute lower abdominal pain.  She was seen in  the [facility] emergency room on February 25, 2007.  CT scan  showed a 17 x 22 cm mass and a second mass measuring 9.3 x 9.2 cm.  This  was further typified by ultrasound and appears to be predominately  cystic without any solid components. She had a previous scan  approximately 2 years ago that did not showing masses.  Her CA-125 value  is 83.6 units/mL.   PAST OBSTETRICAL HISTORY:  Gravida 1.   PAST GYNECOLOGIC HISTORY:  Tubal ligation.  She has irregular menstrual  periods.   PAST MEDICAL HISTORY:  Obesity.   PAST SURGICAL HISTORY:  1. Cholecystectomy.  2. Bilateral tubal ligation.   FAMILY HISTORY:  The patient is adopted.  She does know her family  history.   SOCIAL HISTORY:  The patient is married for 6 years.  She does not  smoke.  She does not work outside the home.   REVIEW OF SYSTEMS:  A 10-point coverage Review of Systems was negative  except as noted above.   PHYSICAL EXAMINATION:  VITAL SIGNS:  Height 5 feet 1 inch, weight 244  pounds. Blood pressure 112/82, pulse 104, respiratory rate 18.  GENERAL:  The patient is an obese white female in no acute distress.  HEENT:  Negative.  NECK:  Supple without thyromegaly.  ADENOPATHY:  There is no supraclavicular or inguinal adenopathy.  ABDOMEN:  Obese, soft, nontender.  There is a palpable  mass that  occupies nearly all of the abdominal cavity.  It is mobile.  No ascites  is noted.  PELVIC:  EG/BUS, vagina, bladder, urethra are normal.  Cervix appears  normal.  Uterus is not detected secondary to the patient's obesity.  There is a fullness in the cul-de-sac.  There is no nodularity or  fixation.  Rectovaginal exam confirms.  LOWER EXTREMITIES:  Without edema or varicosities.   IMPRESSION:  Large bilateral ovarian cysts that appear to be benign on  imaging despite slightly elevated CA-125 value.   The patient will undergo exploratory laparotomy, intraoperative  assessment, intraoperative frozen section, and most likely bilateral  salpingo-oophorectomy and hysterectomy.  Surgical staging for ovarian  cancer will be performed if frozen section returns this to be a  malignancy.   The risks of surgery including hemorrhage, infection, injury to adjacent  viscera, thrombolic complications, anesthetic risks are reviewed with  the patient. She is aware that, because of her obesity, she is  a higher  risk for wound complications, separation, and dehiscence.  She is also  aware that if she loses both ovaries, menopausal symptoms will most  likely occur, and hormone replacement therapy will likely be  recommended.   All her questions were answered.  She will undergo preoperative  evaluation, and we plan surgery in conjunction with Dr. Ilene QuaChristell Constant on April 22, 2006.      De Blanch, M.D.  Electronically Signed     DC/MEDQ  D:  04/22/2007  T:  04/22/2007  Job:  [id number]   cc:   Leighton Roach McDiarmid, M.D.

## 2010-08-30 NOTE — Consult Note (Signed)
NAMESAYANA, SALLEY               ACCOUNT NO.:  [id number]   MEDICAL RECORD NO.:  [id number]         PATIENT TYPE:  LOUT   LOCATION:                               FACILITY:  [facility]   PHYSICIAN:  De Blanch, M.D.DATE OF BIRTH:  05-07-1970   DATE OF CONSULTATION:  03/15/2007  DATE OF DISCHARGE:                                 CONSULTATION   CHIEF COMPLAINT:  Abdominal pelvic mass.   HISTORY OF PRESENT ILLNESS:  This is a 40 year old white married female  seen in consultation at the request of Leighton Roach McDiarmid, M.D. regarding  management of a newly diagnosed large abdominal pelvic mass.  The  patient initially presented with acute lower abdominal pain on  November  10th and was seen in the [facility] emergency room.  Abdominal  pelvic CT scan showed a 17 x 22 cm mass and a second mass measuring 9.3  x 9.2 cm.  This is further being typified by ultrasound and appears to  be a predominantly cystic mass without any solid components.  It is  noted that she has previously had a scan approximately 2 years ago that  did not show any significant mass.   PAST GYNECOLOGIC HISTORY:  1. Gravida 1.  2. She has had a tubal ligation.  3. Her menstrual periods are somewhat irregular.   Currently, her pain is moderate and well controlled with ibuprofen.  CA-  125 value is 83.6 units per mL.  The remainder of her laboratory work  looks reasonably normal except for  a hemoglobin of 9.   PAST MEDICAL HISTORY:  Obesity.   PAST SURGICAL HISTORY:  Cholecystectomy, bilateral tubal ligation.   OBSTETRICAL HISTORY:  Gravida 1.   FAMILY HISTORY:  The patient is adopted and she does not know her family  history.   SOCIAL HISTORY:  The patient is married for 6 years.  She does not  smoke.  She does not work outside the home.   REVIEW OF SYSTEMS:  Ten point comprehensive review of systems is  negative except as noted above.   PHYSICAL EXAMINATION:  VITAL SIGNS:  Height 5'1, weight  244 pounds,  blood pressure 112/82, pulse 104, respiratory rate 18.  GENERAL:  The patient is a healthy white female in no acute distress.  HEENT:  Negative.  NECK:  Supple without thyromegaly.  There is no supraclavicular or  inguinal adenopathy.  ABDOMEN:  Obese, soft and nontender.  There is a palpable mass that  occupies nearly all of her abdominal cavity that is mobile.  No ascites  is noted.  PELVIC EXAM:  EG, BUS, vaginal, bladder and urethra are normal.  Cervix  seems normal.  Uterus is not detected secondary to the patient's  obesity.  There is a fullness in the cul-de-sac without any nodularity  or tenderness.  RECTOVAGINAL EXAM:  Confirms.  LOWER EXTREMITIES:  Without edema or varicosities.   IMPRESSION:  Large bilateral ovarian cysts, most likely benign  cystadenomas.   RECOMMENDATIONS:  Recommend the patient undergo an exploratory  laparotomy to have the cyst removed which would most likely result in  removal of all of her ovarian tissue.  The characteristics of this cyst  on ultrasound appear to be benign although her CA-125 is slightly  elevated which certainly is commonly falsely positive in premenopausal  women.   We have discussed surgical scheduling and the patient preferred to have  the surgery performed in [place].  Given our current schedule, our  earliest operating date would be January 6th.  We will scheduled for  that although, if the patient has more urgent symptoms, we would  accommodate in some other way.  I did offer the opportunity for her to  have surgery at [facility] and that could be performed as early as  December 2nd.  She opts to delay surgery to have it performed in  [place] on January 6th.      De Blanch, M.D.  Electronically Signed     DC/MEDQ  D:  03/15/2007  T:  03/15/2007  Job:  [id number]   cc:   Telford Nab, R.N.  [address]  [place], [place] [address]   Etta Grandchild, M.D.

## 2010-08-30 NOTE — Discharge Summary (Signed)
Jenna Dillon, Jenna Dillon               ACCOUNT NO.:  [id number]   MEDICAL RECORD NO.:  [id number]          PATIENT TYPE:  INP   LOCATION:  1526                         FACILITY:  [facility]   PHYSICIAN:  Roseanna Rainbow, M.D.DATE OF BIRTH:  04-17-1971   DATE OF ADMISSION:  04/23/2007  DATE OF DISCHARGE:  04/26/2007                               DISCHARGE SUMMARY   CHIEF COMPLAINTS:  The patient is a 40 year old Caucasian female who  presents for operative management of bilateral pelvic masses.  Please  see the dictated history and physical for further details.   HOSPITAL COURSE:  The patient was admitted and underwent a total  abdominal hysterectomy and right salpingo-oophorectomy and left  salpingectomy.  Please see the dictated operative summary.  On  postoperative day #1 her hemoglobin was 8.  Preoperatively her  hemoglobin was 10.3.  She was hemodynamically stable.  There is a  question of sleep apnea.  Her primary care Winfield Caba was contacted.  Further evaluation would be arranged post discharge.  The remainder of  her hospital course was uneventful.  She was discharged to home on  postoperative day #3, tolerating a regular diet.   DISCHARGE DIAGNOSES:  1. Endometriosis with an endometriotic cyst and endometrial complex      hyperplasia with atypia.  2. Possible sleep apnea.  3. Anemia, iron deficiency.   PROCEDURES:  Total abdominal hysterectomy and right salpingo-  oophorectomy and left salpingectomy.   CONDITION:  Stable.   DISCHARGE INSTRUCTIONS:  1. Diet:  Regular.  2. Activity:  Progressive activity, pelvic rest.   DISPOSITION:  The patient was to follow up with Dr. Craige Cotta for evaluation  of sleep apnea.  An appointment was made for February 10 at 9:45.  She  is to follow up at the [facility] on January 15 at 5:00 p.m.      Roseanna Rainbow, M.D.  Electronically Signed     LAJ/MEDQ  D:  05/28/2007  T:  05/29/2007  Job:  [id number]   cc:   Telford Nab,  R.N.  [address]  [place], [place] [address]   Etta Grandchild, M.D.

## 2010-09-02 NOTE — Op Note (Signed)
[facility]  Patient:    Jenna Dillon, Jenna Dillon Visit Number: [id number] MRN: [id number]          Service Type: DSU Location: [facility] Attending Physician:  Antionette Char Dictated by:   Roseanna Rainbow, M.D. Proc. Date: 09/13/01 Admit Date:  09/13/2001   CC:         [facility] [address]   Operative Report  PREOPERATIVE DIAGNOSIS:       Multiparity, desires sterilization.  POSTOPERATIVE DIAGNOSIS:      Multiparity, desires sterilization.  PROCEDURE:                    Laparoscopic tubal ligation with bipolar cautery.  SURGEON:                      Roseanna Rainbow, M.D.  ANESTHESIA:                   General endotracheal.  COMPLICATIONS:                None.  ESTIMATED BLOOD LOSS:         25 cc.  FLUIDS:                       As per anesthesiology.  FINDINGS:                     Normal uterus, tubes and ovaries.  There was one small omental adhesion to the anterior abdominal wall.  TECHNIQUE:                    The patient was taken to the operating room, where general anesthesia was obtained without difficulty.  She was placed in the dorsal lithotomy position, prepped and draped in the usual sterile fashion.  A bivalve speculum was then placed in the patients vagina and the anterior lip of the cervix grasped with a single-tooth tenaculum.  A Hulka tenaculum and probe were then advanced into the uterus and secured to the anterior lip of the cervix as a means to manipulate the uterus.  The tenaculum and speculum were then removed.  Attention was then turned to the patients abdomen, where a 10 mm skin incision was made in the umbilical fold.  Two attempts were made to introduce a Veress needle into the peritoneal cavity at a 45-degree angle while tenting the abdominal wall.  Intraperitoneal placement was confirmed by use of a water-filled syringe.  However, there was not an appropriate drop in the  intra-abdominal pressure with insufflation of CO2 gas, likely secondary to faulty equipment.  At this point, the Veress needle was removed and an open technique was then performed.  After dissection through the fascia and the parietal peritoneum, a Hasson trocar and sleeve were then advanced without difficulty into the abdomen, where intra-abdominal placement was confirmed by the laparoscope.  Pneumoperitoneum was obtained with CO2 gas. A survey of the patients pelvis and abdomen revealed the above findings.  The bipolar cautery apparatus was then advanced through the operative port of the laparoscope and the patients left fallopian tube was identified.  It was followed out to the fimbriated end.  A 2-3 cm segment in the midisthmic area was then cauterized.  With each application, the ohmmeter was noted to go to 0. The right fallopian tube was manipulated in a similar fashion.  The instruments were then removed  from the patients abdomen.  The fascial incision was reapproximated with 0 Vicryl.  The skin incision was repaired with 3-0 Vicryl.  The Hulka probe and tenaculum were removed from the vagina with no bleeding noted from the cervix.  The patient tolerated the procedure well.  Sponge, lap and needle counts were correct x2.  The patient was taken to the PACU in stable condition. Dictated by:   Roseanna Rainbow, M.D. Attending Physician:  Antionette Char DD:  09/13/01 TD:  09/14/01 Job: [id number]

## 2010-09-02 NOTE — H&P (Signed)
[facility]  Patient:    Jenna Dillon, HUNT Visit Number: [id number] MRN: [id number]          Service Type: Attending:  Roseanna Rainbow, M.D. Dictated by:   Roseanna Rainbow, M.D.   CC:         [facility]   History and Physical  CHIEF COMPLAINT:              The patient is a 40 year old para 1 who desires a sterilization procedure.  HISTORY OF PRESENT ILLNESS:   As above.  The risks, benefits, and alternative forms of management were reviewed with the patient including but not limited to a failure rate of 4-8:1000 cases with a subsequent increased risk of an ectopic pregnancy should pregnancy occur.  PAST OBSTETRICAL HISTORY:     She denies any history of any sexually-transmitted diseases.  She is status post a cesarean delivery.  PAST MEDICAL HISTORY:         She denies.  PAST SURGICAL HISTORY:        As above.  She has had a laparoscopic cholecystectomy.  ALLERGIES:                    SULFA.  MEDICATIONS:                  Oral contraceptives.  FAMILY HISTORY:               She is adopted.  SOCIAL HISTORY:               She denies any tobacco, ethanol, or substance abuse.  PHYSICAL EXAMINATION:  VITAL SIGNS:                  Blood pressure 110/84, pulse 78, respiratory rate 18, temperature 97.1, height 5 feet 2 inches, weight 206.5 pounds.  GENERAL:                      Overweight, in no apparent distress.  NECK:                         Supple.  No thyromegaly.  ABDOMEN:                      Well-healed cesarean delivery scar.  No organomegaly appreciated.  PELVIC:                       Female genitalia normal appearing.  On speculum exam, the vagina is clean.  There are no lesions noted.  On bimanual exam, this is limited by the patients body habitus; however, the uterus is anterior, small, nontender, normal small.  The adnexa are nonpalpable and nontender.  EXTREMITIES:                  No clubbing, cyanosis, or  edema.  SKIN:                         Without rash.  ASSESSMENT:                   Primipara who desires a sterilization procedure.  PLAN:                         We will review the Pap smears from the [facility].  The  planned procedure is a laparoscopic bilateral tubal ligation.  The patient was given samples for Alesse. Dictated by:   Roseanna Rainbow, M.D. Attending:  Roseanna Rainbow, M.D. DD:  08/12/01 TD:  08/12/01 Job: [id number]

## 2010-09-02 NOTE — Discharge Summary (Signed)
[facility]  Patient:    Jenna Dillon, Jenna Dillon Visit Number: [id number] MRN: [id number]          Service Type: OBS Location: 910A [id number] [id number] Attending Physician:  Antionette Char Dictated by:   Ed Blalock. Burnadette Peter, M.D. Admit Date:  05/13/2001 Discharge Date: 05/17/2001   CC:         Raynelle Jan, M.D., [facility]   Discharge Summary  DATE OF BIRTH:                02-02-1971.  HISTORY OF PRESENT ILLNESS:   This 40 year old G2, P0-0-1-0, presented at 41 weeks with a chief complaint of contractions with increased intensity over the last two days with some bloody show and good fetal movement.  The patient reported q.77m. contractions over the last two days.  She was seen by Dr. Carolyne Fiscal at [facility] onset of care in the first trimester.  The pregnancy was complicated by questionable hydrocephalus on ultrasound of the fetus.  MEDICATIONS:                  She has taken prenatal vitamins daily.  DRUG ALLERGIES:               Her only drug allergy is to SULFA (she has a rash).  PAST OBSTETRICAL HISTORY:     SAB at 8 weeks.  PAST GYNECOLOGICAL HISTORY:   No sexually transmitted diseases.  PAST MEDICAL HISTORY:         History of anxiety and a history of Fen-Phen use with a normal echo.  PAST SURGICAL HISTORY:        Laparoscopic cholecystectomy in 1999.  FAMILY HISTORY:               Noncontributory.  SOCIAL HISTORY:               Denies tobacco, alcohol, or illicit drug use.  PRENATAL LABORATORIES:        Normal with the exception of 1-hour glucola with a negative GTT.  HOSPITAL COURSE:              The patient was admitted for Pitocin augmentation of labor given her initial exam of 2-3 cm, 90%, and -2.  She underwent Pitocin augmentation as well as artificial rupture of membranes with clear fluid.  She had intermittent moderate variable decels as well as fetal tachycardia with decreased variability.  The patient  throughout this dilated up to 600 m with no significant change so she was taken to C-section for arrest of active phase along with the before-mentioned fetal heart rate tracings.  She was delivered of a viable female infant without complications. Her routine postop course was uncomplicated.  She was discharged home on postop day #3 with pain medication, Ortho Evra as requested and iron sulfate b.i.d. given her H&H of 8.5 and 26.  DISCHARGE MEDICATIONS:        1. Percocet as directed.                               2. Motrin as directed.                               3. Ortho Evra as directed.  4. Iron sulfate as directed.  DISCHARGE ACTIVITY:           Pelvic rest otherwise ad lib.  DISCHARGE DIET:               Regular.  DISCHARGE FOLLOWUP:           She is to be seen in MAU in three days for staple removal.  She is to be seen in six weeks by Dr. Carolyne Fiscal for a routine postpartum check.  DISCHARGE DIAGNOSES:          1. Intrauterine pregnancy at term.                               2. Fetal tachycardia and variable decelerations                                  during labor.                               3. Arrest of active phase of labor.                               4. Status post low transverse cesarean section.Dictated by:   Ed Blalock. Burnadette Peter, M.D. Attending Physician:  Antionette Char DD:  05/17/01 TD:  05/17/01 Job: [id number]

## 2010-09-02 NOTE — Op Note (Signed)
[facility]  Patient:    Jenna Dillon, Jenna Dillon Visit Number: [id number] MRN: [id number]          Service Type: MED Location: [facility] Attending Physician:  Michaelle Copas Dictated by:   Roseanna Rainbow, M.D. Proc. Date: 06/13/01 Admit Date:  05/21/2001 Discharge Date: 05/21/2001                             Operative Report  PREOPERATIVE DIAGNOSES:       Intrauterine pregnancy at 41+ weeks, arrest of descent.  POSTOPERATIVE DIAGNOSES:      Intrauterine pregnancy at 41+ weeks, arrest of descent.  PROCEDURE:                    Primary low uterine flap elliptical cesarean section via Pfannenstiel.  SURGEON:                      Roseanna Rainbow, M.D., Ed Blalock. Burnadette Peter, M.D.  ANESTHESIA:                   Spinal.  COMPLICATIONS:                None.  ESTIMATED BLOOD LOSS:         800 cc.  FLUIDS:                       4 L lactated Ringers.  URINE OUTPUT:                 150 cc.  PROCEDURE:                    Patient was taken to the operating room where spinal anesthetic was administered without difficulty.  She was then placed in the dorsal supine position with a leftward tilt and prepped and draped in the usual sterile fashion.  A Pfannenstiel incision was then made with the scalpel and carried down to the underlying fascia with the Bovie.  The fascia was incised in the midline and this incision was extended bilaterally with curved Mayo scissors.  The superior aspect of the fascial incision was then grasped with straight Kochers, tented up, and the underlying rectus muscles dissected off.  The inferior aspect of the fascial incision was then manipulated in a similar fashion.  The rectus muscles were separated in the midline.  The parietoperitoneum was tented up and entered sharply with Metzenbaum scissors. This incision was then extended superiorly and inferiorly with good visualization of the bladder.  The bladder blade was  then placed and the vesicouterine peritoneum tented up and entered sharply with the Metzenbaum scissors.  This incision was then extended bilaterally and the bladder flap created digitally.  The bladder blade was then replaced.  The lower uterine segment was then incised in a transverse fashion.  This incision was then extended bluntly.  The infant was noted to be in cephalic presentation and the head was delivered atraumatically, suctioned with the bulb suction.  The cord was clamped and cut and the infant handed off to the awaiting neonatologists. Apgars were 8 and 9 at one and five minutes.  Weight was 7 pounds 15 ounces. The infant was also noted to be OP.  The placenta was then removed.  The uterus evacuated of amniotic fluid, clots, and debris with a moistened laparotomy sponge.  The uterine incision was then repaired  with 0 Monocryl in a running interlocking fashion.  The middle aspect of the uterine incision was then embrocated with the same suture.  Excellent hemostasis was noted.  The pericolic gutters were then copiously irrigated.  The fascia was reapproximated with 0 Vicryl.  The skin was reapproximated with staples.  At the close of the procedure the instrument and pack counts were said to be correct x2.  The patient was taken to the PACU awake and in stable condition. Dictated by:   Roseanna Rainbow, M.D. Attending Physician:  Michaelle Copas DD:  06/13/01 TD:  06/13/01 Job: [id number]

## 2011-01-04 LAB — COMPREHENSIVE METABOLIC PANEL
Alkaline Phosphatase: 64
BUN: 16
Calcium: 8.9
Creatinine, Ser: 0.68
Glucose, Bld: 109 — ABNORMAL HIGH
Total Protein: 6.8

## 2011-01-04 LAB — DIFFERENTIAL
Basophils Relative: 1
Lymphs Abs: 2.4
Monocytes Relative: 5
Neutro Abs: 6.8
Neutrophils Relative %: 68

## 2011-01-04 LAB — CBC
HCT: 30.8 — ABNORMAL LOW
Hemoglobin: 10.3 — ABNORMAL LOW
MCHC: 33.4
MCHC: 34
MCV: 75.4 — ABNORMAL LOW
RBC: 3.12 — ABNORMAL LOW
RDW: 16.4 — ABNORMAL HIGH

## 2011-01-04 LAB — TYPE AND SCREEN: Antibody Screen: NEGATIVE

## 2011-01-27 ENCOUNTER — Ambulatory Visit: Payer: Self-pay

## 2011-02-01 DIAGNOSIS — J189 Pneumonia, unspecified organism: Secondary | ICD-10-CM | POA: Insufficient documentation

## 2011-02-03 LAB — CBC AND DIFFERENTIAL
HCT: 36 % (ref 36–46)
Hemoglobin: 12.3 g/dL (ref 12.0–16.0)
WBC: 13.4 10^3/mL

## 2011-02-20 ENCOUNTER — Encounter: Payer: Self-pay | Admitting: Family Medicine

## 2011-02-20 ENCOUNTER — Ambulatory Visit (INDEPENDENT_AMBULATORY_CARE_PROVIDER_SITE_OTHER): Payer: Medicaid Other | Admitting: Family Medicine

## 2011-02-20 VITALS — BP 147/95 | HR 103 | Temp 98.0°F | Ht 62.0 in | Wt 258.0 lb

## 2011-02-20 DIAGNOSIS — J189 Pneumonia, unspecified organism: Secondary | ICD-10-CM

## 2011-02-20 MED ORDER — PHENYLEPHRINE-CHLORPHEN-DM 3.5-1-3 MG/ML PO LIQD: 1.0000 mL | Freq: Four times a day (QID) | ORAL | Status: DC | PRN

## 2011-02-20 MED ORDER — CHLORPHENIRAMINE-PSEUDOEPH 12-100 MG PO CP24: 1.0000 | ORAL_CAPSULE | Freq: Two times a day (BID) | ORAL | Status: AC

## 2011-02-20 MED ORDER — ALBUTEROL SULFATE HFA 108 (90 BASE) MCG/ACT IN AERS: 2.0000 | INHALATION_SPRAY | RESPIRATORY_TRACT | Status: DC | PRN

## 2011-02-20 NOTE — Patient Instructions (Addendum)
Take Chlorpheniramin-Pseudophedrine capsule, one each morning to help decrease cough. Your cough will last at least another 2 to 4 weeks, but should get less as time goes by.  Use the Albuterol inhaler when you have coughing or wheezing. You can take it every four hours.

## 2011-02-21 ENCOUNTER — Encounter: Payer: Self-pay | Admitting: Family Medicine

## 2011-02-21 DIAGNOSIS — J189 Pneumonia, unspecified organism: Secondary | ICD-10-CM

## 2011-02-21 NOTE — Progress Notes (Signed)
  Subjective:    Patient ID: Jenna Dillon, female    DOB: [birth year], 40 y.o.   MRN: [id number]  HPI CC: Follow up ED Visit at [facility] were she had paroxysmal cough, worse at night aprox. 2 weeks ago. Started initially on Doxy for pneumonia then on revisit after 2-3 days b/c of no improvement was switched to Levoquin for 7 days which patient completed. Pt started on albuterol which she has found helpful for wheezing and cough.  There are smokers in home.  Pt does not smoke.   Cough persists, but less.  Occasionally productive of pale gray tenacious sputum. Wheezing.  No fever/chills. Mild DOE.  Cough worsens at night. Postnasal drip. No rhinorrhea.   Records from [facility] not available at this time.  They have been requested.   Medications, past medical history,  family history, social history were reviewed and updated.  Review of Systems See HPI     Objective:   Physical Exam  Constitutional: Vital signs are normal. No distress.  HENT:  Right Ear: Tympanic membrane and ear canal normal.  Left Ear: Tympanic membrane and ear canal normal.  Eyes: Conjunctivae are normal.  Cardiovascular: Normal rate, regular rhythm and normal heart sounds.  Exam reveals no gallop.   No murmur heard. Pulmonary/Chest: Effort normal and breath sounds normal. No respiratory distress.          Assessment & Plan:

## 2011-02-21 NOTE — Assessment & Plan Note (Signed)
Currently, suspect pneumonia dx at [facility] ED ~ 2weeks ago is adequately treated with antibiotics and what sounds like a corticosteroid dose pack.  Working explanation of current cough is post-infectious cough.  Will continue Albuterol MDI as needed and add an OTC first generation antihistamine with decongestant scheduled dosing for next 1-2 weeks to help decrease cough.  Will request records from [facility] to review imaging report which included a CT-A Chest.

## 2011-03-23 ENCOUNTER — Ambulatory Visit: Payer: Medicaid Other | Admitting: Family Medicine

## 2011-09-15 NOTE — Progress Notes (Signed)
  Subjective:    Patient ID: Jenna Dillon, female    DOB: 1970/06/14, 41 y.o.   MRN: [id number]  HPI  x  Review of Systems     Objective:   Physical Exam        Assessment & Plan:

## 2011-12-25 ENCOUNTER — Ambulatory Visit: Payer: Medicaid Other | Admitting: Family Medicine

## 2012-01-18 ENCOUNTER — Ambulatory Visit (INDEPENDENT_AMBULATORY_CARE_PROVIDER_SITE_OTHER): Payer: Medicaid Other | Admitting: Family Medicine

## 2012-01-18 ENCOUNTER — Encounter: Payer: Self-pay | Admitting: Family Medicine

## 2012-01-18 VITALS — BP 122/76 | HR 66 | Temp 98.6°F | Ht 62.0 in | Wt 266.0 lb

## 2012-01-18 DIAGNOSIS — Z23 Encounter for immunization: Secondary | ICD-10-CM

## 2012-01-18 DIAGNOSIS — K432 Incisional hernia without obstruction or gangrene: Secondary | ICD-10-CM | POA: Insufficient documentation

## 2012-01-18 DIAGNOSIS — K469 Unspecified abdominal hernia without obstruction or gangrene: Secondary | ICD-10-CM

## 2012-01-18 HISTORY — DX: Incisional hernia without obstruction or gangrene: K43.2

## 2012-01-18 LAB — COMPREHENSIVE METABOLIC PANEL
ALT: 21 U/L (ref 0–35)
CO2: 23 mEq/L (ref 19–32)
Calcium: 8.7 mg/dL (ref 8.4–10.5)
Chloride: 108 mEq/L (ref 96–112)
Potassium: 4.2 mEq/L (ref 3.5–5.3)
Sodium: 140 mEq/L (ref 135–145)
Total Protein: 6.7 g/dL (ref 6.0–8.3)

## 2012-01-18 LAB — CBC
Platelets: 205 10*3/uL (ref 150–400)
RBC: 4.36 MIL/uL (ref 3.87–5.11)
WBC: 9.8 10*3/uL (ref 4.0–10.5)

## 2012-01-18 NOTE — Patient Instructions (Addendum)
You have an abdominal hernia.  We are referring you to General Surgery for them to discuss treatment options.  If you develop intolerable pain or fever then go to the Emergency Room.Hernia A hernia happens when an organ inside your body pushes out through a weak spot in your belly (abdominal) wall. Most hernias get worse over time. They can often be pushed back into place (reduced). Surgery may be needed to repair hernias that cannot be pushed into place. HOME CARE  Keep doing normal activities.   Avoid lifting more than 10 pounds (4.5 kilograms).   Cough gently and avoid straining. Over time, these things will:   Increase your hernia size.   Irritate your hernia.   Break down hernia repairs.   Stop smoking.   Do not wear anything tight over your hernia. Do not keep the hernia in with an outside bandage.   Eat food that is high in fiber (fruit, vegetables, whole grains).   Drink enough fluids to keep your pee (urine) clear or pale yellow.   Take medicines to make your poop soft (stool softeners) if you cannot poop (constipated).  GET HELP RIGHT AWAY IF:    You have a fever.   You have belly pain that gets worse.   You feel sick to your stomach (nauseous) and throw up (vomit).   Your skin starts to bulge out.   Your hernia turns a different color, feels hard, or is tender.   You have increased pain or puffiness (swelling) around the hernia.   You poop more or less often.   Your poop does not look the way normally does.   You have watery poop (diarrhea).   You cannot push the hernia back in place by applying gentle pressure while lying down.  MAKE SURE YOU:    Understand these instructions.   Will watch your condition.   Will get help right away if you are not doing well or get worse.  Document Released: 09/21/2009 Document Revised: 06/26/2011 Document Reviewed: 09/21/2009 [facility] Patient Information 2013 [place], [place].

## 2012-01-18 NOTE — Assessment & Plan Note (Signed)
Possible Ventral surgical incision hernia. Not incarcerated currently. Symptomatic with chronic discomfort.  Plan: Referral to [facility] for evalution and treatment.  Pre-operative labs obtained.  Pt education about hernias with red flags for seeking immediate care given to patient.

## 2012-01-18 NOTE — Progress Notes (Signed)
  Subjective:    Patient ID: Jenna Dillon, female    DOB: December 01, 1970, 41 y.o.   MRN: [id number]  HPI Abdominal Mass First noticed about 6 months ago Discomfort over mass, especially at night.  Often interferes with sleep.  Holds a pillow against mass at night to decrease discomfort Discomfort has been worsening over last month.  Mass enlarges after standing prolonged time PMH significant for abdominal hysterectomy with unilateral oopherectomy for benigh cystic pelvic masses  by Dr Stanford Breed in 2009.   Review of Systems No fever, No constipation. No diarrhea. No nausea vomiting. Good appetite.      Objective:   Physical Exam VS noted. Gen: Groomed, morbid truncal obesity Abdomen: midline healed surgical incision from suprapubic to superior to umbilicus Palpable midline defect of anterior abdomen containing with palpable soft content bulging thru defect. Mild tenderness to palpation. No overlying erythema or edema. Bulge increases in size with valsalva maneuver.  (+) bowel sounds.  No HSM.   Lungs BCTA, no acc mm use Cor: RRR, NO M/G/R. Ext: trace ankle edema bilateral.        Assessment & Plan:

## 2012-01-19 LAB — APTT: aPTT: 41 seconds — ABNORMAL HIGH (ref 24–37)

## 2012-01-24 ENCOUNTER — Encounter (HOSPITAL_COMMUNITY): Payer: Self-pay | Admitting: Pharmacy Technician

## 2012-01-24 ENCOUNTER — Encounter (HOSPITAL_COMMUNITY): Payer: Self-pay | Admitting: *Deleted

## 2012-01-24 ENCOUNTER — Encounter (INDEPENDENT_AMBULATORY_CARE_PROVIDER_SITE_OTHER): Payer: Self-pay | Admitting: Surgery

## 2012-01-24 ENCOUNTER — Ambulatory Visit (INDEPENDENT_AMBULATORY_CARE_PROVIDER_SITE_OTHER): Payer: Medicaid Other | Admitting: Surgery

## 2012-01-24 VITALS — BP 128/88 | HR 94 | Temp 96.7°F | Ht 62.0 in | Wt 267.8 lb

## 2012-01-24 DIAGNOSIS — K432 Incisional hernia without obstruction or gangrene: Secondary | ICD-10-CM

## 2012-01-24 DIAGNOSIS — K219 Gastro-esophageal reflux disease without esophagitis: Secondary | ICD-10-CM

## 2012-01-24 DIAGNOSIS — R0602 Shortness of breath: Secondary | ICD-10-CM

## 2012-01-24 HISTORY — DX: Shortness of breath: R06.02

## 2012-01-24 HISTORY — DX: Gastro-esophageal reflux disease without esophagitis: K21.9

## 2012-01-24 NOTE — Progress Notes (Signed)
Subjective:     Patient ID: Jenna Dillon, female   DOB: October 15, 1970, 41 y.o.   MRN: [id number]  HPI  Jenna Dillon  [birth year] [id number]  Patient Care Team: Leighton Roach McDiarmid, MD as PCP - General (Family Medicine) Jeannette Corpus, MD as Consulting Physician (Gynecology) Antionette Char, MD as Consulting Physician (Obstetrics and Gynecology)  This patient is a 41 y.o.female who presents today for surgical evaluation at the request of Dr. Sherron Monday .  For visit: Swelling along incision.  Concern for hernia.  Morbidly obese female.  Former smoker.  Had an episode of pneumonia and bronchitis six months ago.  Severe coughing problems.  Ultimately required a codeine-based cough suppressant to control.  She started feeling a lump around that time.  Above her bellybutton.  Exit gotten a little larger.  Can be very uncomfortable.  She was concerned.  She saw her primary care physician whom was concerned for a hernia.  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  She recalls some separation of skin at the incision but no major wound infection.  Husband smokes but tries to stay away from her when he does that.  She has 2-3 loose bowel movements a day ever since she had her cholecystectomy a decade ago.  No skin infections/MRSA.   Patient walks 15 minutes for about .25 miles without difficulty.  No exertional chest/neck/shoulder/arm pain.   Patient Active Problem List  Diagnosis  . Obesity, Class III, BMI 40-49.9 (morbid obesity)  . PANIC ATTACKS, History of  . HOT FLASHES  . Abdominal hernia without obstruction or gangrene    Past Medical History  Diagnosis Date  . Obesity, Class III, BMI 40-49.9 (morbid obesity) 06/14/2006    Qualifier: Diagnosis of  By: McDiarmid MD, Tawanna Cooler    . PANIC ATTACKS, History of 06/14/2006    Qualifier: History of  By: McDiarmid MD, Tawanna Cooler      Past Surgical History  Procedure Date  .  Oophorectomy     Unilateral (uncertain side): BENIGN CYST, CONSISTENT WITH PARATUBAL CYST  . Abdominal hysterectomy 04/2007    Dr Stanford Breed (GYN ONC) at [facility]: BENIGN ENDOMETRIOTIC CYST    History   Social History  . Marital Status: Married    Spouse Name: N/A    Number of Children: N/A  . Years of Education: N/A   Occupational History  . Not on file.   Social History Main Topics  . Smoking status: Former Games developer  . Smokeless tobacco: Former Neurosurgeon    Quit date: 01/23/2001  . Alcohol Use: No  . Drug Use: No  . Sexually Active: Not on file   Other Topics Concern  . Not on file   Social History Narrative  . No narrative on file    Family History  Problem Relation Age of Onset  . Heart disease Mother   . Cancer Father     lung     No current outpatient prescriptions on file.     Allergies  Allergen Reactions  . Sulfamethoxazole     REACTION: rash    BP 128/88  Pulse 94  Temp 96.7 F (35.9 C) (Temporal)  Ht 5\' 2"  (1.575 m)  Wt 267 lb 12.8 oz (121.473 kg)  BMI 48.98 kg/m2  SpO2 98%  No results found.   Review of Systems  Constitutional: Negative for fever, chills, diaphoresis, appetite change and fatigue.  HENT: Negative for ear pain,  sore throat, trouble swallowing, neck pain and ear discharge.   Eyes: Negative for photophobia, discharge and visual disturbance.  Respiratory: Positive for shortness of breath. Negative for cough, choking and chest tightness.   Cardiovascular: Negative for chest pain and palpitations.  Gastrointestinal: Positive for abdominal pain. Negative for nausea, vomiting, diarrhea, constipation, anal bleeding and rectal pain.  Genitourinary: Negative for dysuria, frequency and difficulty urinating.  Musculoskeletal: Negative for myalgias and gait problem.  Skin: Negative for color change, pallor and rash.  Neurological: Negative for dizziness, speech difficulty, weakness and numbness.  Hematological: Negative for adenopathy.    Psychiatric/Behavioral: Negative for confusion and agitation. The patient is not nervous/anxious.        Objective:   Physical Exam  Constitutional: She is oriented to person, place, and time. She appears well-developed and well-nourished. No distress.  HENT:  Head: Normocephalic.  Mouth/Throat: Oropharynx is clear and moist. No oropharyngeal exudate.  Eyes: Conjunctivae normal and EOM are normal. Pupils are equal, round, and reactive to light. No scleral icterus.  Neck: Normal range of motion. Neck supple. No tracheal deviation present.  Cardiovascular: Normal rate, regular rhythm and intact distal pulses.   Pulmonary/Chest: Effort normal and breath sounds normal. No respiratory distress. She exhibits no tenderness.  Abdominal: Soft. She exhibits no distension and no mass. There is tenderness in the epigastric area. A hernia is present. Hernia confirmed negative in the right inguinal area and confirmed negative in the left inguinal area.    Genitourinary: No vaginal discharge found.  Musculoskeletal: Normal range of motion. She exhibits no tenderness.  Lymphadenopathy:    She has no cervical adenopathy.       Right: No inguinal adenopathy present.       Left: No inguinal adenopathy present.  Neurological: She is alert and oriented to person, place, and time. No cranial nerve deficit. She exhibits normal muscle tone. Coordination normal.  Skin: Skin is warm and dry. No rash noted. She is not diaphoretic. No erythema.  Psychiatric: She has a normal mood and affect. Her behavior is normal. Judgment and thought content normal.       Assessment:     Incisional VWH    Plan:     I think because the defect does not seem massive, it is reasonable to start out laparoscopically.  I worried she may have numerous Swiss cheese hernias along her incision, but the only area seems to be supraumbilical.  She will require a giant sheet of mesh to counteract her morbid obesity making hernia  recurrence more likely.  I noted given her obesity, it will take some time for her pain and soreness to resolve.  This is a sore surgery even if I do it laparoscopically.   I did discuss procedure with she and her husband:  The anatomy & physiology of the abdominal wall was discussed.  The pathophysiology of hernias was discussed.  Natural history risks without surgery including progeressive enlargement, pain, incarceration & strangulation was discussed.   Contributors to complications such as smoking, obesity, diabetes, prior surgery, etc were discussed.   I feel the risks of no intervention will lead to serious problems that outweigh the operative risks; therefore, I recommended surgery to reduce and repair the hernia.  I explained laparoscopic techniques with possible need for an open approach.  I noted the probable use of mesh to patch and/or buttress the hernia repair  Risks such as bleeding, infection, abscess, need for further treatment, heart attack, death, and other risks were  discussed.  I noted a good likelihood this will help address the problem.   Goals of post-operative recovery were discussed as well.  Possibility that this will not correct all symptoms was explained.  I stressed the importance of low-impact activity, aggressive pain control, avoiding constipation, & not pushing through pain to minimize risk of post-operative chronic pain or injury. Possibility of reherniation especially with smoking, obesity, diabetes, immunosuppression, and other health conditions was discussed.  We will work to minimize complications.     An educational handout further explaining the pathology & treatment options was given as well.  Questions were answered.  The patient expresses understanding & wishes to proceed with surgery.   Try to avoid being near smokers as well.  Notes he smokes mainly outside her with a good to minimize exposure to his wife.

## 2012-01-24 NOTE — Progress Notes (Signed)
01-24-12 1530 Instructed pt. As SDS labs.Instructed on Hibiclens soap shower x2 nights prior.(concern for cost) "may not can afford" -instructed pt. To use dial soap and let Short Stay know if couldn't afford hibiclens. Will have responsible adult as driver and Z61 hours once discharged home. Jenna Dillon

## 2012-01-24 NOTE — Patient Instructions (Addendum)
See the Handout(s) we gave you.  Consider surgery.  Please call our office at [phone] if you wish to schedule surgery or if you have further questions / concerns.   Hernia A hernia occurs when an internal organ pushes out through a weak spot in the abdominal wall. Hernias most commonly occur in the groin and around the navel. Hernias often can be pushed back into place (reduced). Most hernias tend to get worse over time. Some abdominal hernias can get stuck in the opening (irreducible or incarcerated hernia) and cannot be reduced. An irreducible abdominal hernia which is tightly squeezed into the opening is at risk for impaired blood supply (strangulated hernia). A strangulated hernia is a medical emergency. Because of the risk for an irreducible or strangulated hernia, surgery may be recommended to repair a hernia. CAUSES   Heavy lifting.  Prolonged coughing.  Straining to have a bowel movement.  A cut (incision) made during an abdominal surgery. HOME CARE INSTRUCTIONS   Bed rest is not required. You may continue your normal activities.  Avoid lifting more than 10 pounds (4.5 kg) or straining.  Cough gently. If you are a smoker it is best to stop. Even the best hernia repair can break down with the continual strain of coughing. Even if you do not have your hernia repaired, a cough will continue to aggravate the problem.  Do not wear anything tight over your hernia. Do not try to keep it in with an outside bandage or truss. These can damage abdominal contents if they are trapped within the hernia sac.  Eat a normal diet.  Avoid constipation. Straining over long periods of time will increase hernia size and encourage breakdown of repairs. If you cannot do this with diet alone, stool softeners may be used. SEEK IMMEDIATE MEDICAL CARE IF:   You have a fever.  You develop increasing abdominal pain.  You feel nauseous or vomit.  Your hernia is stuck outside the abdomen, looks  discolored, feels hard, or is tender.  You have any changes in your bowel habits or in the hernia that are unusual for you.  You have increased pain or swelling around the hernia.  You cannot push the hernia back in place by applying gentle pressure while lying down. MAKE SURE YOU:   Understand these instructions.  Will watch your condition.  Will get help right away if you are not doing well or get worse. Document Released: 04/03/2005 Document Revised: 06/26/2011 Document Reviewed: 11/21/2007 [facility] Patient Information 2013 [place].  Obesity Obesity is defined as having too much total body fat and a body mass index (BMI) of 30 or more. BMI is an estimate of body fat and is calculated from your height and weight. Obesity happens when you consume more calories than you can burn by exercising or performing daily physical tasks. Prolonged obesity can cause major illnesses or emergencies, such as:   A stroke.  Heart disease.  Diabetes.  Cancer.  Arthritis.  High blood pressure (hypertension).  High cholesterol.  Sleep apnea.  Erectile dysfunction.  Infertility problems. CAUSES   Regularly eating unhealthy foods.  Physical inactivity.  Certain disorders, such as an underactive thyroid (hypothyroidism), Cushing's syndrome, and polycystic ovarian syndrome.  Certain medicines, such as steroids, some depression medicines, and antipsychotics.  Genetics.  Lack of sleep. DIAGNOSIS  A caregiver can diagnose obesity after calculating your BMI. Obesity will be diagnosed if your BMI is 30 or higher.  There are other methods of measuring obesity  levels. Some other methods include measuring your skin fold thickness, your waist circumference, and comparing your hip circumference to your waist circumference. TREATMENT  A healthy treatment program includes some or all of the following:  Long-term dietary changes.  Exercise and physical activity.  Behavioral and  lifestyle changes.  Medicine only under the supervision of your caregiver. Medicines may help, but only if they are used with diet and exercise programs. An unhealthy treatment program includes:  Fasting.  Fad diets.  Supplements and drugs. These choices do not succeed in long-term weight control.  HOME CARE INSTRUCTIONS   Exercise and perform physical activity as directed by your caregiver. To increase physical activity, try the following:  Use stairs instead of elevators.  Park farther away from store entrances.  Garden, bike, or walk instead of watching television or using the computer.  Eat healthy, low-calorie foods and drinks on a regular basis. Eat more fruits and vegetables. Use low-calorie cookbooks or take healthy cooking classes.  Limit fast food, sweets, and processed snack foods.  Eat smaller portions.  Keep a daily journal of everything you eat. There are many free websites to help you with this. It may be helpful to measure your foods so you can determine if you are eating the correct portion sizes.  Avoid drinking alcohol. Drink more water and drinks without calories.  Take vitamins and supplements only as recommended by your caregiver.  Weight-loss support groups, Optometrist, counselors, and stress reduction education can also be very helpful. SEEK IMMEDIATE MEDICAL CARE IF:  You have chest pain or tightness.  You have trouble breathing or feel short of breath.  You have weakness or leg numbness.  You feel confused or have trouble talking.  You have sudden changes in your vision. MAKE SURE YOU:  Understand these instructions.  Will watch your condition.  Will get help right away if you are not doing well or get worse. Document Released: 05/11/2004 Document Revised: 10/03/2011 Document Reviewed: 05/10/2011 [facility] Patient Information 2013 [place], [place].

## 2012-01-25 MED ORDER — CHLORHEXIDINE GLUCONATE 4 % EX LIQD
1.0000 "application " | Freq: Once | CUTANEOUS | Status: DC
  Filled 2012-01-25: qty 15

## 2012-01-25 MED ORDER — DEXTROSE 5 % IV SOLN
3.0000 g | INTRAVENOUS | Status: AC
  Administered 2012-01-26: 3 g via INTRAVENOUS
  Filled 2012-01-25: qty 3000

## 2012-01-26 ENCOUNTER — Encounter (HOSPITAL_COMMUNITY): Payer: Self-pay | Admitting: *Deleted

## 2012-01-26 ENCOUNTER — Inpatient Hospital Stay (HOSPITAL_COMMUNITY)
Admission: RE | Admit: 2012-01-26 | Discharge: 2012-01-30 | DRG: 336 | Disposition: A | Discharge status: Home / Self Care | Payer: Medicaid Other | Source: Ambulatory Visit | Attending: Surgery | Admitting: Surgery

## 2012-01-26 ENCOUNTER — Ambulatory Visit (HOSPITAL_COMMUNITY): Payer: Medicaid Other | Admitting: *Deleted

## 2012-01-26 ENCOUNTER — Encounter (HOSPITAL_COMMUNITY)
Admission: RE | Disposition: A | Discharge status: Home / Self Care | Payer: Self-pay | Source: Ambulatory Visit | Attending: Surgery

## 2012-01-26 DIAGNOSIS — Z6841 Body Mass Index (BMI) 40.0 and over, adult: Secondary | ICD-10-CM

## 2012-01-26 DIAGNOSIS — E669 Obesity, unspecified: Secondary | ICD-10-CM | POA: Diagnosis present

## 2012-01-26 DIAGNOSIS — K560 Paralytic ileus: Secondary | ICD-10-CM | POA: Diagnosis not present

## 2012-01-26 DIAGNOSIS — K430 Incisional hernia with obstruction, without gangrene: Principal | ICD-10-CM | POA: Diagnosis present

## 2012-01-26 DIAGNOSIS — K432 Incisional hernia without obstruction or gangrene: Secondary | ICD-10-CM

## 2012-01-26 DIAGNOSIS — K436 Other and unspecified ventral hernia with obstruction, without gangrene: Secondary | ICD-10-CM

## 2012-01-26 HISTORY — DX: Gastro-esophageal reflux disease without esophagitis: K21.9

## 2012-01-26 HISTORY — DX: Pneumonia, unspecified organism: J18.9

## 2012-01-26 HISTORY — PX: VENTRAL HERNIA REPAIR: SHX424

## 2012-01-26 HISTORY — DX: Shortness of breath: R06.02

## 2012-01-26 LAB — CREATININE, SERUM
Creatinine, Ser: 0.74 mg/dL (ref 0.50–1.10)
GFR calc Af Amer: >90 mL/min (ref >90)
GFR calc non Af Amer: >90 mL/min (ref >90)

## 2012-01-26 LAB — SURGICAL PCR SCREEN: Staphylococcus aureus: NEGATIVE

## 2012-01-26 LAB — CBC
HCT: 34.3 % — ABNORMAL LOW (ref 36.0–46.0)
Hemoglobin: 11.4 g/dL — ABNORMAL LOW (ref 12.0–15.0)
MCHC: 33.2 g/dL (ref 30.0–36.0)
WBC: 18.7 10*3/uL — ABNORMAL HIGH (ref 4.0–10.5)

## 2012-01-26 SURGERY — REPAIR, HERNIA, VENTRAL, LAPAROSCOPIC
Anesthesia: General | Site: Abdomen | Wound class: Clean Contaminated

## 2012-01-26 MED ORDER — MAGIC MOUTHWASH
15.0000 mL | Freq: Four times a day (QID) | ORAL | Status: DC | PRN
  Filled 2012-01-26: qty 15

## 2012-01-26 MED ORDER — ACETAMINOPHEN 500 MG PO TABS
1000.0000 mg | ORAL_TABLET | Freq: Three times a day (TID) | ORAL | Status: DC
  Administered 2012-01-26 – 2012-01-28 (×4): 1000 mg via ORAL
  Filled 2012-01-26 (×11): qty 2

## 2012-01-26 MED ORDER — HEPARIN SODIUM (PORCINE) 5000 UNIT/ML IJ SOLN
5000.0000 [IU] | Freq: Three times a day (TID) | INTRAMUSCULAR | Status: DC
  Administered 2012-01-26 – 2012-01-30 (×11): 5000 [IU] via SUBCUTANEOUS
  Filled 2012-01-26 (×14): qty 1

## 2012-01-26 MED ORDER — CEFAZOLIN SODIUM-DEXTROSE 2-3 GM-% IV SOLR
2.0000 g | Freq: Three times a day (TID) | INTRAVENOUS | Status: AC
  Administered 2012-01-26 – 2012-01-27 (×2): 2 g via INTRAVENOUS
  Filled 2012-01-26 (×2): qty 50

## 2012-01-26 MED ORDER — NEOSTIGMINE METHYLSULFATE 1 MG/ML IJ SOLN
INTRAMUSCULAR | Status: DC | PRN
  Administered 2012-01-26: 5 mg via INTRAVENOUS

## 2012-01-26 MED ORDER — DIPHENHYDRAMINE HCL 50 MG/ML IJ SOLN
12.5000 mg | Freq: Four times a day (QID) | INTRAMUSCULAR | Status: DC | PRN
  Administered 2012-01-27 (×2): 25 mg via INTRAVENOUS
  Filled 2012-01-26 (×2): qty 1

## 2012-01-26 MED ORDER — OXYCODONE HCL 5 MG PO TABS
5.0000 mg | ORAL_TABLET | ORAL | Status: DC | PRN
  Administered 2012-01-27 (×3): 10 mg via ORAL
  Filled 2012-01-26 (×3): qty 2

## 2012-01-26 MED ORDER — ONDANSETRON HCL 4 MG/2ML IJ SOLN
INTRAMUSCULAR | Status: DC | PRN
  Administered 2012-01-26: 4 mg via INTRAVENOUS

## 2012-01-26 MED ORDER — ROCURONIUM BROMIDE 100 MG/10ML IV SOLN
INTRAVENOUS | Status: DC | PRN
  Administered 2012-01-26: 10 mg via INTRAVENOUS
  Administered 2012-01-26: 50 mg via INTRAVENOUS
  Administered 2012-01-26 (×3): 10 mg via INTRAVENOUS

## 2012-01-26 MED ORDER — LORAZEPAM BOLUS VIA INFUSION: 0.5000 mg | Freq: Three times a day (TID) | INTRAVENOUS | Status: DC | PRN

## 2012-01-26 MED ORDER — CEFAZOLIN SODIUM 1-5 GM-% IV SOLN
INTRAVENOUS | Status: AC
  Filled 2012-01-26: qty 50

## 2012-01-26 MED ORDER — METOPROLOL TARTRATE 12.5 MG HALF TABLET
12.5000 mg | ORAL_TABLET | Freq: Two times a day (BID) | ORAL | Status: DC | PRN
  Filled 2012-01-26: qty 1

## 2012-01-26 MED ORDER — SODIUM CHLORIDE 0.9 % IJ SOLN
3.0000 mL | Freq: Two times a day (BID) | INTRAMUSCULAR | Status: DC
  Administered 2012-01-30: 3 mL via INTRAVENOUS

## 2012-01-26 MED ORDER — LACTATED RINGERS IV BOLUS (SEPSIS): 1000.0000 mL | Freq: Three times a day (TID) | INTRAVENOUS | Status: AC | PRN

## 2012-01-26 MED ORDER — HYDROMORPHONE HCL PF 1 MG/ML IJ SOLN
INTRAMUSCULAR | Status: AC
  Filled 2012-01-26: qty 1

## 2012-01-26 MED ORDER — LACTATED RINGERS IV SOLN: INTRAVENOUS | Status: DC

## 2012-01-26 MED ORDER — LIP MEDEX EX OINT
1.0000 "application " | TOPICAL_OINTMENT | Freq: Two times a day (BID) | CUTANEOUS | Status: DC
  Administered 2012-01-26 – 2012-01-30 (×8): 1 via TOPICAL
  Filled 2012-01-26: qty 7

## 2012-01-26 MED ORDER — HYDROMORPHONE HCL PF 1 MG/ML IJ SOLN
0.5000 mg | INTRAMUSCULAR | Status: DC | PRN
  Administered 2012-01-27 – 2012-01-29 (×9): 1 mg via INTRAVENOUS
  Filled 2012-01-26 (×9): qty 1

## 2012-01-26 MED ORDER — PROMETHAZINE HCL 25 MG/ML IJ SOLN: 6.2500 mg | INTRAMUSCULAR | Status: DC | PRN

## 2012-01-26 MED ORDER — BUPIVACAINE 0.25 % ON-Q PUMP DUAL CATH 300 ML
300.0000 mL | INJECTION | Status: DC
  Filled 2012-01-26: qty 300

## 2012-01-26 MED ORDER — HYDROMORPHONE HCL PF 1 MG/ML IJ SOLN
0.2500 mg | INTRAMUSCULAR | Status: DC | PRN
  Administered 2012-01-26 (×2): 0.5 mg via INTRAVENOUS

## 2012-01-26 MED ORDER — BUPIVACAINE-EPINEPHRINE 0.25% -1:200000 IJ SOLN
INTRAMUSCULAR | Status: AC
  Filled 2012-01-26: qty 1

## 2012-01-26 MED ORDER — ALBUTEROL SULFATE (5 MG/ML) 0.5% IN NEBU: 2.5000 mg | INHALATION_SOLUTION | Freq: Four times a day (QID) | RESPIRATORY_TRACT | Status: DC | PRN

## 2012-01-26 MED ORDER — LACTATED RINGERS IV SOLN
INTRAVENOUS | Status: DC
  Administered 2012-01-26: 1000 mL via INTRAVENOUS
  Administered 2012-01-26 (×2): via INTRAVENOUS

## 2012-01-26 MED ORDER — ALUM & MAG HYDROXIDE-SIMETH 200-200-20 MG/5ML PO SUSP
30.0000 mL | Freq: Four times a day (QID) | ORAL | Status: DC | PRN
Start: 2012-01-26 — End: 2012-01-30

## 2012-01-26 MED ORDER — LACTATED RINGERS IV SOLN
INTRAVENOUS | Status: DC
  Administered 2012-01-26 – 2012-01-27 (×3): via INTRAVENOUS
  Administered 2012-01-28: 20 mL/h via INTRAVENOUS

## 2012-01-26 MED ORDER — BUPIVACAINE-EPINEPHRINE PF 0.25-1:200000 % IJ SOLN
INTRAMUSCULAR | Status: AC
  Filled 2012-01-26: qty 30

## 2012-01-26 MED ORDER — PSYLLIUM 95 % PO PACK
1.0000 | PACK | Freq: Two times a day (BID) | ORAL | Status: DC
  Administered 2012-01-26 – 2012-01-28 (×5): 1 via ORAL
  Filled 2012-01-26 (×7): qty 1

## 2012-01-26 MED ORDER — BISACODYL 10 MG RE SUPP: 10.0000 mg | Freq: Two times a day (BID) | RECTAL | Status: DC | PRN

## 2012-01-26 MED ORDER — MAGNESIUM HYDROXIDE 400 MG/5ML PO SUSP
30.0000 mL | Freq: Two times a day (BID) | ORAL | Status: DC | PRN
  Filled 2012-01-26: qty 30

## 2012-01-26 MED ORDER — LORAZEPAM 2 MG/ML IJ SOLN: 0.5000 mg | Freq: Three times a day (TID) | INTRAMUSCULAR | Status: DC | PRN

## 2012-01-26 MED ORDER — FENTANYL CITRATE 0.05 MG/ML IJ SOLN
INTRAMUSCULAR | Status: DC | PRN
  Administered 2012-01-26 (×2): 50 ug via INTRAVENOUS
  Administered 2012-01-26 (×2): 25 ug via INTRAVENOUS
  Administered 2012-01-26: 100 ug via INTRAVENOUS
  Administered 2012-01-26: 25 ug via INTRAVENOUS
  Administered 2012-01-26: 50 ug via INTRAVENOUS
  Administered 2012-01-26: 25 ug via INTRAVENOUS

## 2012-01-26 MED ORDER — HYDROMORPHONE HCL PF 1 MG/ML IJ SOLN
INTRAMUSCULAR | Status: DC | PRN
  Administered 2012-01-26 (×2): 0.5 mg via INTRAVENOUS
  Administered 2012-01-26: 1 mg via INTRAVENOUS
  Administered 2012-01-26: 0.5 mg via INTRAVENOUS
  Administered 2012-01-26: 1 mg via INTRAVENOUS
  Administered 2012-01-26: 0.5 mg via INTRAVENOUS

## 2012-01-26 MED ORDER — BUPIVACAINE 0.5 % ON-Q PUMP DUAL CATH 300 ML
300.0000 mL | INJECTION | Status: DC
  Filled 2012-01-26: qty 300

## 2012-01-26 MED ORDER — LACTATED RINGERS IR SOLN
Status: DC | PRN
  Administered 2012-01-26: 3000 mL

## 2012-01-26 MED ORDER — MIDAZOLAM HCL 5 MG/5ML IJ SOLN
INTRAMUSCULAR | Status: DC | PRN
  Administered 2012-01-26: 2 mg via INTRAVENOUS

## 2012-01-26 MED ORDER — OXYCODONE HCL 5 MG PO TABS: 5.0000 mg | ORAL_TABLET | ORAL | Status: DC | PRN

## 2012-01-26 MED ORDER — BUPIVACAINE-EPINEPHRINE 0.25% -1:200000 IJ SOLN
INTRAMUSCULAR | Status: DC | PRN
  Administered 2012-01-26: 80 mL

## 2012-01-26 MED ORDER — SODIUM CHLORIDE 0.9 % IV SOLN: 250.0000 mL | INTRAVENOUS | Status: DC | PRN

## 2012-01-26 MED ORDER — ACETAMINOPHEN 10 MG/ML IV SOLN
INTRAVENOUS | Status: DC | PRN
  Administered 2012-01-26: 1000 mg via INTRAVENOUS

## 2012-01-26 MED ORDER — GLYCOPYRROLATE 0.2 MG/ML IJ SOLN
INTRAMUSCULAR | Status: DC | PRN
  Administered 2012-01-26: 0.6 mg via INTRAVENOUS

## 2012-01-26 MED ORDER — PROPOFOL 10 MG/ML IV BOLUS
INTRAVENOUS | Status: DC | PRN
  Administered 2012-01-26: 180 mg via INTRAVENOUS

## 2012-01-26 MED ORDER — ONDANSETRON HCL 4 MG/2ML IJ SOLN: 4.0000 mg | Freq: Four times a day (QID) | INTRAMUSCULAR | Status: DC | PRN

## 2012-01-26 MED ORDER — HETASTARCH-ELECTROLYTES 6 % IV SOLN
INTRAVENOUS | Status: DC | PRN
  Administered 2012-01-26: 15:00:00 via INTRAVENOUS

## 2012-01-26 MED ORDER — SODIUM CHLORIDE 0.9 % IJ SOLN: 3.0000 mL | INTRAMUSCULAR | Status: DC | PRN

## 2012-01-26 MED ORDER — MUPIROCIN 2 % EX OINT
TOPICAL_OINTMENT | Freq: Two times a day (BID) | CUTANEOUS | Status: DC
  Administered 2012-01-26: 1 via NASAL
  Filled 2012-01-26: qty 22

## 2012-01-26 MED ORDER — HEPARIN SODIUM (PORCINE) 5000 UNIT/ML IJ SOLN
5000.0000 [IU] | Freq: Once | INTRAMUSCULAR | Status: AC
  Administered 2012-01-26: 5000 [IU] via SUBCUTANEOUS
  Filled 2012-01-26: qty 1

## 2012-01-26 MED ORDER — PROMETHAZINE HCL 25 MG/ML IJ SOLN: 12.5000 mg | Freq: Four times a day (QID) | INTRAMUSCULAR | Status: DC | PRN

## 2012-01-26 MED ORDER — KETOROLAC TROMETHAMINE 30 MG/ML IJ SOLN
INTRAMUSCULAR | Status: DC | PRN
  Administered 2012-01-26: 30 mg via INTRAVENOUS

## 2012-01-26 MED ORDER — DEXAMETHASONE SODIUM PHOSPHATE 10 MG/ML IJ SOLN
INTRAMUSCULAR | Status: DC | PRN
  Administered 2012-01-26: 10 mg via INTRAVENOUS

## 2012-01-26 MED ORDER — LIDOCAINE HCL (CARDIAC) 20 MG/ML IV SOLN
INTRAVENOUS | Status: DC | PRN
  Administered 2012-01-26: 80 mg via INTRAVENOUS

## 2012-01-26 MED ORDER — ACETAMINOPHEN 10 MG/ML IV SOLN
INTRAVENOUS | Status: AC
  Filled 2012-01-26: qty 100

## 2012-01-26 MED ORDER — CEFAZOLIN SODIUM-DEXTROSE 2-3 GM-% IV SOLR
INTRAVENOUS | Status: AC
  Filled 2012-01-26: qty 50

## 2012-01-26 MED ORDER — MEPERIDINE HCL 50 MG/ML IJ SOLN: 6.2500 mg | INTRAMUSCULAR | Status: DC | PRN

## 2012-01-26 SURGICAL SUPPLY — 44 items
APPLIER CLIP 5 13 M/L LIGAMAX5 (MISCELLANEOUS)
BINDER ABD UNIV 12 45-62 (WOUND CARE) ×1 IMPLANT
BINDER ABDOMINAL 46IN 62IN (WOUND CARE) ×2
CANISTER SUCTION 2500CC (MISCELLANEOUS) ×2 IMPLANT
CATH KIT ON Q 5IN SLV (PAIN MANAGEMENT) ×4 IMPLANT
CLIP APPLIE 5 13 M/L LIGAMAX5 (MISCELLANEOUS) IMPLANT
CLOTH BEACON ORANGE TIMEOUT ST (SAFETY) ×2 IMPLANT
DECANTER SPIKE VIAL GLASS SM (MISCELLANEOUS) ×2 IMPLANT
DEVICE SECURE STRAP 25 ABSORB (INSTRUMENTS) ×4 IMPLANT
DEVICE TROCAR PUNCTURE CLOSURE (ENDOMECHANICALS) ×2 IMPLANT
DRAPE LAPAROSCOPIC ABDOMINAL (DRAPES) ×2 IMPLANT
DRSG TEGADERM 2-3/8X2-3/4 SM (GAUZE/BANDAGES/DRESSINGS) ×8 IMPLANT
ELECT REM PT RETURN 9FT ADLT (ELECTROSURGICAL) ×2
ELECTRODE REM PT RTRN 9FT ADLT (ELECTROSURGICAL) ×1 IMPLANT
FILTER SMOKE EVAC LAPAROSHD (FILTER) IMPLANT
GAUZE SPONGE 2X2 8PLY STRL LF (GAUZE/BANDAGES/DRESSINGS) ×2 IMPLANT
GLOVE ECLIPSE 8.0 STRL XLNG CF (GLOVE) ×2 IMPLANT
GLOVE INDICATOR 8.0 STRL GRN (GLOVE) ×4 IMPLANT
GOWN STRL NON-REIN LRG LVL3 (GOWN DISPOSABLE) ×2 IMPLANT
GOWN STRL REIN XL XLG (GOWN DISPOSABLE) ×8 IMPLANT
HAND ACTIVATED (MISCELLANEOUS) IMPLANT
KIT BASIN OR (CUSTOM PROCEDURE TRAY) ×2 IMPLANT
MESH PHYSIO OVAL 15X20CM (Mesh General) ×2 IMPLANT
MESH PHYSIO OVAL 25X35CM (Mesh General) ×2 IMPLANT
NEEDLE SPNL 22GX3.5 QUINCKE BK (NEEDLE) IMPLANT
NS IRRIG 1000ML POUR BTL (IV SOLUTION) ×2 IMPLANT
PEN SKIN MARKING BROAD (MISCELLANEOUS) ×2 IMPLANT
PENCIL BUTTON HOLSTER BLD 10FT (ELECTRODE) IMPLANT
SCISSORS LAP 5X35 DISP (ENDOMECHANICALS) ×2 IMPLANT
SET IRRIG TUBING LAPAROSCOPIC (IRRIGATION / IRRIGATOR) ×2 IMPLANT
SLEEVE Z-THREAD 5X100MM (TROCAR) ×4 IMPLANT
SPONGE GAUZE 2X2 STER 10/PKG (GAUZE/BANDAGES/DRESSINGS) ×2
STRIP CLOSURE SKIN 1/2X4 (GAUZE/BANDAGES/DRESSINGS) ×10 IMPLANT
SUT MNCRL AB 4-0 PS2 18 (SUTURE) ×2 IMPLANT
SUT PROLENE 1 CT 1 30 (SUTURE) ×26 IMPLANT
SUT VIC AB 2-0 UR6 27 (SUTURE) IMPLANT
TOWEL OR 17X26 10 PK STRL BLUE (TOWEL DISPOSABLE) ×2 IMPLANT
TRAY FOLEY CATH 14FRSI W/METER (CATHETERS) ×2 IMPLANT
TRAY LAP CHOLE (CUSTOM PROCEDURE TRAY) ×2 IMPLANT
TROCAR XCEL BLADELESS 5X75MML (TROCAR) IMPLANT
TROCAR Z-THREAD FIOS 11X100 BL (TROCAR) ×2 IMPLANT
TROCAR Z-THREAD FIOS 5X100MM (TROCAR) ×4 IMPLANT
TUBING INSUFFLATION 10FT LAP (TUBING) ×2 IMPLANT
TUNNELER SHEATH ON-Q 16GX12 DP (PAIN MANAGEMENT) ×2 IMPLANT

## 2012-01-26 NOTE — Anesthesia Preprocedure Evaluation (Addendum)
Anesthesia Evaluation  Patient identified by MRN, date of birth, ID band Patient awake    Reviewed: Allergy & Precautions, H&P , NPO status , Patient's Chart, lab work & pertinent test results  Airway Mallampati: II TM Distance: >3 FB Neck ROM: Full    Dental No notable dental hx.    Pulmonary neg pulmonary ROS, shortness of breath and with exertion, neg pneumonia -,  breath sounds clear to auscultation  Pulmonary exam normal       Cardiovascular negative cardio ROS  Rhythm:Regular Rate:Normal     Neuro/Psych negative neurological ROS  negative psych ROS   GI/Hepatic negative GI ROS, Neg liver ROS,   Endo/Other  negative endocrine ROSMorbid obesity  Renal/GU negative Renal ROS  negative genitourinary   Musculoskeletal negative musculoskeletal ROS (+)   Abdominal   Peds negative pediatric ROS (+)  Hematology negative hematology ROS (+)   Anesthesia Other Findings   Reproductive/Obstetrics negative OB ROS                          Anesthesia Physical Anesthesia Plan  ASA: III  Anesthesia Plan: General   Post-op Pain Management:    Induction: Intravenous  Airway Management Planned: Oral ETT  Additional Equipment:   Intra-op Plan:   Post-operative Plan: Extubation in OR  Informed Consent: I have reviewed the patients History and Physical, chart, labs and discussed the procedure including the risks, benefits and alternatives for the proposed anesthesia with the patient or authorized representative who has indicated his/her understanding and acceptance.   Dental advisory given  Plan Discussed with: CRNA  Anesthesia Plan Comments:         Anesthesia Quick Evaluation

## 2012-01-26 NOTE — Interval H&P Note (Signed)
History and Physical Interval Note:  01/26/2012 12:56 PM  Jenna Dillon  has presented today for surgery, with the diagnosis of Ventral wall hernia  The various methods of treatment have been discussed with the patient and family. After consideration of risks, benefits and other options for treatment, the patient has consented to  Procedure(s) (LRB) with comments: LAPAROSCOPIC VENTRAL HERNIA (N/A) - Laparoscopic Ventral Wall Hernia Repair with Mesh INSERTION OF MESH (N/A) as a surgical intervention .  The patient's history has been reviewed, patient examined, no change in status, stable for surgery.  I have reviewed the patient's chart and labs.  Questions were answered to the patient's satisfaction.     [name]

## 2012-01-26 NOTE — Transfer of Care (Signed)
Immediate Anesthesia Transfer of Care Note  Patient: Jenna Dillon  Procedure(s) Performed: Procedure(s) (LRB) with comments: LAPAROSCOPIC VENTRAL HERNIA (N/A) - Laparoscopic Incarcerated Ventral Hernia Repair x 4 with Mesh INSERTION OF MESH (N/A)  Patient Location: PACU  Anesthesia Type: General  Level of Consciousness: awake, unresponsive and responds to stimulation  Airway & Oxygen Therapy: Patient Spontanous Breathing and Patient connected to face mask oxygen  Post-op Assessment: Report given to PACU RN, Post -op Vital signs reviewed and stable and Patient moving all extremities X 4  Post vital signs: Reviewed and stable  Complications: No apparent anesthesia complications

## 2012-01-26 NOTE — Brief Op Note (Signed)
01/26/2012  4:46 PM  PATIENT:  Jenna Dillon  41 y.o. female  PRE-OPERATIVE DIAGNOSIS:  Ventral wall hernia  POST-OPERATIVE DIAGNOSIS:  Ventral wall hernia  PROCEDURE:  Procedure(s) (LRB) with comments: LAPAROSCOPIC VENTRAL HERNIA (N/A) - Laparoscopic Incarcerated Ventral Hernia Repair x 4 with Mesh INSERTION OF MESH (N/A)  SURGEON:  Surgeon(s) and Role:    * Ardeth Sportsman, MD - Primary  PHYSICIAN ASSISTANT:   ASSISTANTS: none   ANESTHESIA:   local and general  EBL:  Total I/O In: 2500 [I.V.:2000; IV Piggyback:500] Out: 100 [Urine:100]  BLOOD ADMINISTERED:none  DRAINS: none   LOCAL MEDICATIONS USED:  BUPIVICAINE   SPECIMEN:  No Specimen  DISPOSITION OF SPECIMEN:  N/A  COUNTS:  YES  TOURNIQUET:  * No tourniquets in log *  DICTATION: .Other Dictation: Dictation Number   PLAN OF CARE: Admit for overnight observation  PATIENT DISPOSITION:  PACU - hemodynamically stable.   Delay start of Pharmacological VTE agent (>24hrs) due to surgical blood loss or risk of bleeding: no

## 2012-01-26 NOTE — Anesthesia Postprocedure Evaluation (Signed)
  Anesthesia Post-op Note  Patient: Jenna Dillon  Procedure(s) Performed: Procedure(s) (LRB): LAPAROSCOPIC VENTRAL HERNIA (N/A) INSERTION OF MESH (N/A)  Patient Location: PACU  Anesthesia Type: General  Level of Consciousness: awake and alert   Airway and Oxygen Therapy: Patient Spontanous Breathing  Post-op Pain: mild  Post-op Assessment: Post-op Vital signs reviewed, Patient's Cardiovascular Status Stable, Respiratory Function Stable, Patent Airway and No signs of Nausea or vomiting  Post-op Vital Signs: stable  Complications: No apparent anesthesia complications

## 2012-01-26 NOTE — Preoperative (Signed)
Beta Blockers   Reason not to administer Beta Blockers:Not Applicable 

## 2012-01-26 NOTE — H&P (View-Only) (Signed)
Subjective:     Patient ID: Jenna Dillon, female   DOB: 02/11/1971, 41 y.o.   MRN: [id number]  HPI  Jenna Dillon  05/29/1970 [id number]  Patient Care Team: Todd D McDiarmid, MD as PCP - General (Family Medicine) Daniel L Clarke-Pearson, MD as Consulting Physician (Gynecology) Lisa Jackson-Moore, MD as Consulting Physician (Obstetrics and Gynecology)  This patient is a 41 y.o.female who presents today for surgical evaluation at the request of Dr. MacDiarmid .  For visit: Swelling along incision.  Concern for hernia.  Morbidly obese female.  Former smoker.  Had an episode of pneumonia and bronchitis six months ago.  Severe coughing problems.  Ultimately required a codeine-based cough suppressant to control.  She started feeling a lump around that time.  Above her bellybutton.  Exit gotten a little larger.  Can be very uncomfortable.  She was concerned.  She saw her primary care physician whom was concerned for a hernia.  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  She recalls some separation of skin at the incision but no major wound infection.  Husband smokes but tries to stay away from her when he does that.  She has 2-3 loose bowel movements a day ever since she had her cholecystectomy a decade ago.  No skin infections/MRSA.   Patient walks 15 minutes for about .25 miles without difficulty.  No exertional chest/neck/shoulder/arm pain.   Patient Active Problem List  Diagnosis  . Obesity, Class III, BMI 40-49.9 (morbid obesity)  . PANIC ATTACKS, History of  . HOT FLASHES  . Abdominal hernia without obstruction or gangrene    Past Medical History  Diagnosis Date  . Obesity, Class III, BMI 40-49.9 (morbid obesity) 06/14/2006    Qualifier: Diagnosis of  By: McDiarmid MD, Todd    . PANIC ATTACKS, History of 06/14/2006    Qualifier: History of  By: McDiarmid MD, Todd      Past Surgical History  Procedure Date  .  Oophorectomy     Unilateral (uncertain side): BENIGN CYST, CONSISTENT WITH PARATUBAL CYST  . Abdominal hysterectomy 04/2007    Dr Clarke-Pearson (GYN ONC) at [facility]: BENIGN ENDOMETRIOTIC CYST    History   Social History  . Marital Status: Married    Spouse Name: N/A    Number of Children: N/A  . Years of Education: N/A   Occupational History  . Not on file.   Social History Main Topics  . Smoking status: Former Smoker  . Smokeless tobacco: Former User    Quit date: 01/23/2001  . Alcohol Use: No  . Drug Use: No  . Sexually Active: Not on file   Other Topics Concern  . Not on file   Social History Narrative  . No narrative on file    Family History  Problem Relation Age of Onset  . Heart disease Mother   . Cancer Father     lung     No current outpatient prescriptions on file.     Allergies  Allergen Reactions  . Sulfamethoxazole     REACTION: rash    BP 128/88  Pulse 94  Temp 96.7 F (35.9 C) (Temporal)  Ht 5' 2" (1.575 m)  Wt 267 lb 12.8 oz (121.473 kg)  BMI 48.98 kg/m2  SpO2 98%  No results found.   Review of Systems  Constitutional: Negative for fever, chills, diaphoresis, appetite change and fatigue.  HENT: Negative for ear pain,   sore throat, trouble swallowing, neck pain and ear discharge.   Eyes: Negative for photophobia, discharge and visual disturbance.  Respiratory: Positive for shortness of breath. Negative for cough, choking and chest tightness.   Cardiovascular: Negative for chest pain and palpitations.  Gastrointestinal: Positive for abdominal pain. Negative for nausea, vomiting, diarrhea, constipation, anal bleeding and rectal pain.  Genitourinary: Negative for dysuria, frequency and difficulty urinating.  Musculoskeletal: Negative for myalgias and gait problem.  Skin: Negative for color change, pallor and rash.  Neurological: Negative for dizziness, speech difficulty, weakness and numbness.  Hematological: Negative for adenopathy.    Psychiatric/Behavioral: Negative for confusion and agitation. The patient is not nervous/anxious.        Objective:   Physical Exam  Constitutional: She is oriented to person, place, and time. She appears well-developed and well-nourished. No distress.  HENT:  Head: Normocephalic.  Mouth/Throat: Oropharynx is clear and moist. No oropharyngeal exudate.  Eyes: Conjunctivae normal and EOM are normal. Pupils are equal, round, and reactive to light. No scleral icterus.  Neck: Normal range of motion. Neck supple. No tracheal deviation present.  Cardiovascular: Normal rate, regular rhythm and intact distal pulses.   Pulmonary/Chest: Effort normal and breath sounds normal. No respiratory distress. She exhibits no tenderness.  Abdominal: Soft. She exhibits no distension and no mass. There is tenderness in the epigastric area. A hernia is present. Hernia confirmed negative in the right inguinal area and confirmed negative in the left inguinal area.    Genitourinary: No vaginal discharge found.  Musculoskeletal: Normal range of motion. She exhibits no tenderness.  Lymphadenopathy:    She has no cervical adenopathy.       Right: No inguinal adenopathy present.       Left: No inguinal adenopathy present.  Neurological: She is alert and oriented to person, place, and time. No cranial nerve deficit. She exhibits normal muscle tone. Coordination normal.  Skin: Skin is warm and dry. No rash noted. She is not diaphoretic. No erythema.  Psychiatric: She has a normal mood and affect. Her behavior is normal. Judgment and thought content normal.       Assessment:     Incisional VWH    Plan:     I think because the defect does not seem massive, it is reasonable to start out laparoscopically.  I worried she may have numerous Swiss cheese hernias along her incision, but the only area seems to be supraumbilical.  She will require a giant sheet of mesh to counteract her morbid obesity making hernia  recurrence more likely.  I noted given her obesity, it will take some time for her pain and soreness to resolve.  This is a sore surgery even if I do it laparoscopically.   I did discuss procedure with she and her husband:  The anatomy & physiology of the abdominal wall was discussed.  The pathophysiology of hernias was discussed.  Natural history risks without surgery including progeressive enlargement, pain, incarceration & strangulation was discussed.   Contributors to complications such as smoking, obesity, diabetes, prior surgery, etc were discussed.   I feel the risks of no intervention will lead to serious problems that outweigh the operative risks; therefore, I recommended surgery to reduce and repair the hernia.  I explained laparoscopic techniques with possible need for an open approach.  I noted the probable use of mesh to patch and/or buttress the hernia repair  Risks such as bleeding, infection, abscess, need for further treatment, heart attack, death, and other risks were   discussed.  I noted a good likelihood this will help address the problem.   Goals of post-operative recovery were discussed as well.  Possibility that this will not correct all symptoms was explained.  I stressed the importance of low-impact activity, aggressive pain control, avoiding constipation, & not pushing through pain to minimize risk of post-operative chronic pain or injury. Possibility of reherniation especially with smoking, obesity, diabetes, immunosuppression, and other health conditions was discussed.  We will work to minimize complications.     An educational handout further explaining the pathology & treatment options was given as well.  Questions were answered.  The patient expresses understanding & wishes to proceed with surgery.   Try to avoid being near smokers as well.  Notes he smokes mainly outside her with a good to minimize exposure to his wife.      

## 2012-01-27 NOTE — Progress Notes (Signed)
Right lower quadrant lap site has been reinforced x2 secondary to some bloody drainage. RN will continue to monitor and reinforce as needed.

## 2012-01-27 NOTE — Progress Notes (Signed)
Patient ID: Jenna Dillon, female   DOB: 1970-11-16, 41 y.o.   MRN: [id number] [facility] Progress Note:   1 Day Post-Op  Subjective: Mental status is clear.  Sore but trying to get up and walk Objective: Vital signs in last 24 hours: Temp:  [97.6 F (36.4 C)-98.7 F (37.1 C)] 98.2 F (36.8 C) (10/12 0617) Pulse Rate:  [70-103] 75  (10/12 0800) Resp:  [15-18] 18  (10/12 0617) BP: (96-143)/(53-79) 112/69 mmHg (10/12 0617) SpO2:  [94 %-100 %] 96 % (10/12 0800)  Intake/Output from previous day: 10/11 0701 - 10/12 0700 In: 4477.5 [I.V.:3877.5; IV Piggyback:600] Out: 750 [Urine:750] Intake/Output this shift: Total I/O In: 120 [P.O.:120] Out: -   Physical Exam: Work of breathing is normal.  Multiple trocar sites OK  Lab Results:  Results for orders placed during the hospital encounter of 01/26/12 (from the past 48 hour(s))  SURGICAL PCR SCREEN     Status: Normal   Collection Time   01/26/12 11:05 AM      Component Value Range Comment   MRSA, PCR NEGATIVE  NEGATIVE    Staphylococcus aureus NEGATIVE  NEGATIVE   CBC     Status: Abnormal   Collection Time   01/26/12  5:32 PM      Component Value Range Comment   WBC 18.7 (*) 4.0 - 10.5 K/uL    RBC 3.97  3.87 - 5.11 MIL/uL    Hemoglobin 11.4 (*) 12.0 - 15.0 g/dL    HCT 40.9 (*) 81.1 - 46.0 %    MCV 86.4  78.0 - 100.0 fL    MCH 28.7  26.0 - 34.0 pg    MCHC 33.2  30.0 - 36.0 g/dL    RDW 91.4  78.2 - 95.6 %    Platelets 213  150 - 400 K/uL   CREATININE, SERUM     Status: Normal   Collection Time   01/26/12  5:32 PM      Component Value Range Comment   Creatinine, Ser 0.74  0.50 - 1.10 mg/dL    GFR calc non Af Amer >90  >90 mL/min    GFR calc Af Amer >90  >90 mL/min     Radiology/Results: No results found.  Anti-infectives: Anti-infectives     Start     Dose/Rate Route Frequency Ordered Stop   01/26/12 2200   ceFAZolin (ANCEF) IVPB 2 g/50 mL premix        2 g 100 mL/hr over 30 Minutes Intravenous 3 times  per day 01/26/12 1856 01/27/12 0635   01/25/12 1615   ceFAZolin (ANCEF) 3 g in dextrose 5 % 50 mL IVPB        3 g 160 mL/hr over 30 Minutes Intravenous 60 min pre-op 01/25/12 1615 01/26/12 1343          Assessment/Plan: Problem List: Patient Active Problem List  Diagnosis  . Obesity, Class III, BMI 40-49.9 (morbid obesity)  . PANIC ATTACKS, History of  . HOT FLASHES  . Incisional hernia    Doing well after lap ventral hernia repair.  Not ready for discharge yet.  Taking PO. 1 Day Post-Op    LOS: 1 day   Matt B. Daphine Deutscher, MD, [facility] [phone] beeper [phone]  01/27/2012 12:33 PM

## 2012-01-28 MED ORDER — MAGNESIUM HYDROXIDE 400 MG/5ML PO SUSP
30.0000 mL | Freq: Every day | ORAL | Status: DC
  Administered 2012-01-28: 30 mL via ORAL

## 2012-01-28 MED ORDER — DOCUSATE SODIUM 100 MG PO CAPS
100.0000 mg | ORAL_CAPSULE | Freq: Two times a day (BID) | ORAL | Status: DC
  Administered 2012-01-28 (×2): 100 mg via ORAL
  Filled 2012-01-28 (×4): qty 1

## 2012-01-28 NOTE — Progress Notes (Signed)
Patient is having a lot of difficulty with mobility largely due to size and she is rather short in statue. The bathroom toilet is too low for the patient and it took 3 staff members to assist her off the toilet back to her chair. We tried a bedside commode that was wide enough but too high.

## 2012-01-28 NOTE — Progress Notes (Signed)
2 Days Post-Op  Subjective: Needs requiring IV pain meds.  Constipated.  Needs assistance to get OOB.  Objective: Vital signs in last 24 hours: Temp:  [97.9 F (36.6 C)-98.2 F (36.8 C)] 97.9 F (36.6 C) (10/13 0625) Pulse Rate:  [84-96] 84  (10/13 0625) Resp:  [16-17] 17  (10/13 0625) BP: (98-113)/(56-68) 113/68 mmHg (10/13 0625) SpO2:  [91 %-100 %] 91 % (10/13 0625) Last BM Date: 01/25/12  Intake/Output from previous day: 10/12 0701 - 10/13 0700 In: 120 [P.O.:120] Out: -  Intake/Output this shift:    PE: Abd-soft, obese, dressings dry.  Lab Results:   Basename 01/26/12 1732  WBC 18.7*  HGB 11.4*  HCT 34.3*  PLT 213   BMET  Basename 01/26/12 1732  NA --  K --  CL --  CO2 --  GLUCOSE --  BUN --  CREATININE 0.74  CALCIUM --   PT/INR No results found for this basename: LABPROT:2,INR:2 in the last 72 hours Comprehensive Metabolic Panel:    Component Value Date/Time   NA 140 01/18/2012 1054   K 4.2 01/18/2012 1054   CL 108 01/18/2012 1054   CO2 23 01/18/2012 1054   BUN 19 01/18/2012 1054   CREATININE 0.74 01/26/2012 1732   CREATININE 0.76 01/18/2012 1054   GLUCOSE 95 01/18/2012 1054   CALCIUM 8.7 01/18/2012 1054   AST 16 01/18/2012 1054   ALT 21 01/18/2012 1054   ALKPHOS 66 01/18/2012 1054   BILITOT 0.5 01/18/2012 1054   PROT 6.7 01/18/2012 1054   ALBUMIN 3.9 01/18/2012 1054     Studies/Results: No results found.  Anti-infectives: Anti-infectives     Start     Dose/Rate Route Frequency Ordered Stop   01/26/12 2200   ceFAZolin (ANCEF) IVPB 2 g/50 mL premix        2 g 100 mL/hr over 30 Minutes Intravenous 3 times per day 01/26/12 1856 01/27/12 0635   01/25/12 1615   ceFAZolin (ANCEF) 3 g in dextrose 5 % 50 mL IVPB        3 g 160 mL/hr over 30 Minutes Intravenous 60 min pre-op 01/25/12 1615 01/26/12 1343          Assessment Principal Problem:  *Incisional hernia-s/p laparoscopic repair with mesh on 10/11-still with significant pain; needs  assistance getting OOB. Active Problems:  Obesity, Class III, BMI 40-49.9 (morbid obesity)    LOS: 2 days   Plan: Stool softener and laxative.  Home when pain control is improved and mobility is better.   Jenna Dillon J 01/28/2012

## 2012-01-29 ENCOUNTER — Encounter (HOSPITAL_COMMUNITY): Payer: Self-pay | Admitting: Surgery

## 2012-01-29 MED ORDER — HYDROMORPHONE HCL PF 1 MG/ML IJ SOLN: 0.5000 mg | INTRAMUSCULAR | Status: DC | PRN

## 2012-01-29 MED ORDER — OXYCODONE HCL 5 MG PO TABS
10.0000 mg | ORAL_TABLET | ORAL | Status: DC | PRN
  Administered 2012-01-30: 10 mg via ORAL
  Filled 2012-01-29: qty 2

## 2012-01-29 MED ORDER — MAGNESIUM HYDROXIDE 400 MG/5ML PO SUSP
30.0000 mL | Freq: Every day | ORAL | Status: DC
  Administered 2012-01-29: 30 mL via ORAL
  Filled 2012-01-29: qty 30

## 2012-01-29 MED ORDER — ACETAMINOPHEN 325 MG PO TABS: 325.0000 mg | ORAL_TABLET | Freq: Four times a day (QID) | ORAL | Status: DC | PRN

## 2012-01-29 MED ORDER — HYDROMORPHONE HCL PF 1 MG/ML IJ SOLN
0.5000 mg | INTRAMUSCULAR | Status: DC | PRN
  Administered 2012-01-29 – 2012-01-30 (×3): 1 mg via INTRAVENOUS
  Filled 2012-01-29 (×3): qty 1

## 2012-01-29 MED ORDER — IBUPROFEN 800 MG PO TABS
800.0000 mg | ORAL_TABLET | Freq: Four times a day (QID) | ORAL | Status: DC
  Administered 2012-01-29 – 2012-01-30 (×5): 800 mg via ORAL
  Filled 2012-01-29 (×8): qty 1

## 2012-01-29 MED ORDER — PSYLLIUM 95 % PO PACK
1.0000 | PACK | Freq: Three times a day (TID) | ORAL | Status: DC
  Administered 2012-01-29 – 2012-01-30 (×4): 1 via ORAL
  Filled 2012-01-29 (×6): qty 1

## 2012-01-29 MED ORDER — BUPIVACAINE 0.25 % ON-Q PUMP DUAL CATH 300 ML
300.0000 mL | INJECTION | Status: DC
  Filled 2012-01-29 (×2): qty 300

## 2012-01-29 MED ORDER — ACETAMINOPHEN 650 MG RE SUPP: 650.0000 mg | Freq: Four times a day (QID) | RECTAL | Status: DC | PRN

## 2012-01-29 MED ORDER — OXYCODONE HCL 5 MG PO TABS: 5.0000 mg | ORAL_TABLET | ORAL | Status: DC | PRN

## 2012-01-29 NOTE — Op Note (Signed)
Jenna Dillon, Jenna Dillon NO.:  [id number]  MEDICAL RECORD NO.:  [id number]  LOCATION:  1508                         FACILITY:  [facility]  PHYSICIAN:  Ardeth Sportsman, MD     DATE OF BIRTH:  26-Apr-1970  DATE OF PROCEDURE:  01/26/2012 DATE OF DISCHARGE:                              OPERATIVE REPORT   PRIMARY CARE PHYSICIAN:  Leighton Roach McDiarmid, M.D.  GYNECOLOGIST/ONCOLOGIST:  De Blanch, M.D. and Roseanna Rainbow, M.D.  SURGEON:  Ardeth Sportsman, MD  PREOPERATIVE DIAGNOSIS:  Ventral incisional hernia x1.  POSTOPERATIVE DIAGNOSIS:  Incarcerated ventral incisional hernias x4.  PROCEDURE PERFORMED:  Reduction repair of incarcerated ventral incisional hernias x4 with mesh x2.  Laparoscopic lysis of adhesions x45 minutes.  ANESTHESIA: 1. General anesthesia. 2. Local anesthetic in a field block on all port sites. 3. Bupivacaine On-Q pain pump in preperitoneal plane.  SPECIMENS:  None.  DRAINS:  None.  ESTIMATED BLOOD LOSS:  Minimal.  COMPLICATIONS:  None.  INDICATIONS:  Ms. Abdelnour is a 41 year old morbidly obese female Status post open resection of a large ovarian mass years ago, who has been struggling with a supraumbilical mass that has increased in size with severe pain and discomfort.  She is trying to lose weight but cannot exercise secondary to the pain and discomfort.  She was sent to me for surgical evaluation.  I was concerned for an incisional hernia that I cannot fully reduce and recommended a repair.  Laparoscopic versus open techniques were discussed.  Risks, benefits, and alternatives were discussed.  She has recently quit smoking and noted that smoking, obesity, and other risk factors make likelihood of hernias to recur much higher.  Questions were answered and she and her husband agreed to proceed.  OPERATIVE FINDINGS:  She had 3 supraumbilical incisional hernias incarcerated with omentum.  They were 6 x 5, 4 x 3 and 3 x 3 cm.   There was a 3 x 4 cm infraumbilical hernia as well.  This involved most of the midline of her abdomen.  It was incarcerated with greater omentum but not colon or bowel.  DESCRIPTION OF PROCEDURE:  Informed consent was confirmed.  The patient underwent general anesthesia without any difficulty.  She was positioned supine with arms tucked and carefully positioned.  Her abdomen was prepped and draped in sterile fashion.  Surgical time-out confirmed our plan.  I placed a 5-mm port in left upper quadrant using optical entry technique with the patient in steep reverse Trendelenburg and left side up.  Entry was clean.  Inspection revealed a large swath of omentum incarcerated in the midline third of the abdomen.  I placed a 5-mm port in the left mid abdomen and left lower quadrant and up sized the left upper quadrant port to a 10 mm port site.  I proceeded to free the greater omentum off the anterior abdominal wall. I used careful blunt dissection as well as focused cautery scissors. This took about 45 minutes, was about a third of the case.  Eventually, I was able to reduce down the greater omentum out after its moderate adhesions to the hernia sacs.  I measured out those defects.  I  chose a 35 x 25 cm dual-sided mesh (Physiomesh = Ultra lightweight polypropylene/Monocryl covering).  I laid it transversely in the upper abdomen and secured it to the anterior abdominal wall using #1 Prolene transfascial stitches x18 around the periphery.  This provided at least 7-10 cm circumferential coverage around all the hernia defects.  Given her morbid obesity, I decided on placing a larger sheet of mesh to help decrease recurrence rate.  I placed a 20 x 15 cm mesh transverselydown the infraumbilical region since this has been in the panniculus and an area of high pressure as well.  I was able to lay all the stitches above the pubis and stay away from the bladder.  The dissection sheath required  12 individual stitches.  I ensured hemostasis.  I used absorbable tacker (SecureStrap) to help secure the edges and center as of the mesh.  I placed a 0 Vicryl around the 10-mm port site using laparoscopic guidance.  I placed the On-Q catheter sheath in the preperitoneal plane under direct laparoscopic guidance starting in the subxiphoid region and ending down towards the bilateral posterior flanks.  I evacuated carbon dioxide with ports.  I tied the 10 mm port site down.  I closed the port sites using 4-0 Monocryl stitch.  I closed the skin with Steri-Strips.  The catheters were placed in the On-Q sheath, sheaths peeling away, and catheters secured with Steri-Strips and sterile dressing.  The patient was extubated and sent to recovery room.  I discussed postop care in detail with the patient's husband.  Questions were answered.  He expressed understanding and appreciation.     Ardeth Sportsman, MD     SCG/MEDQ  D:  01/29/2012  T:  01/29/2012  Job:  [id number]

## 2012-01-29 NOTE — Progress Notes (Signed)
Jenna Dillon [id number] 09/02/1970   Subjective:  Sore.  PO meds starting to help but needed IV No BM yet Up in chair. Walking in hallways  Objective:  Vital signs:  Filed Vitals:   01/28/12 0625 01/28/12 1406 01/28/12 2200 01/28/12 2300  BP: 113/68 98/65 110/72 105/22  Pulse: 84 99 101 94  Temp: 97.9 F (36.6 C) 97.1 F (36.2 C) 98.2 F (36.8 C) 98.4 F (36.9 C)  TempSrc: Oral Oral Oral Oral  Resp: 17 18 20 18   Height:      Weight:      SpO2: 91% 95% 98% 99%    Last BM Date: 01/25/12  Intake/Output   Yesterday:  10/13 0701 - 10/14 0700 In: 120 [P.O.:120] Out: -  This shift:     Bowel function:  Flatus: y  BM: n  Physical Exam:  General: Pt awake/alert/oriented x4 in no acute distress Eyes: PERRL, normal EOM.  Sclera clear.  No icterus Neuro: CN II-XII intact w/o focal sensory/motor deficits. Lymph: No head/neck/groin lymphadenopathy Psych:  No delerium/psychosis/paranoia HENT: Normocephalic, Mucus membranes moist.  No thrush Neck: Supple, No tracheal deviation Chest: Mild chest wall pain w good excursion CV:  Pulses intact.  Regular rhythm Abdomen: Soft.  Morbidly obese  Mildly distended.  Mild/mod'ly tender at incisions only.  No incarcerated hernias. Ext:  SCDs BLE.  No mjr edema.  No cyanosis Skin: No petechiae / purpurae  Problem List:  Principal Problem:  *Incisional hernia Active Problems:  Obesity, Class III, BMI 40-49.9 (morbid obesity)   Assessment  Jenna Dillon  41 y.o. female  3 Days Post-Op  Procedure(s): LAPAROSCOPIC VENTRAL HERNIA INSERTION OF MESH  Ileus resolving  Plan:  -inc bowel regimen -inc PO pain control -renew On-Q when runs out -VTE prophylaxis- SCDs, etc -mobilize as tolerated to help recovery -possible D/Ctomorrow if can improve PO pain control, etc  Ardeth Sportsman, M.D., F.A.C.S. Gastrointestinal and Minimally Invasive Surgery [facility] [address] [place], [place] [address] [phone] Main / Paging [phone] Voice Mail   01/29/2012  CARE TEAM:  PCP: Etta Grandchild, MD  Outpatient Care Team: Patient Care Team: Leighton Roach McDiarmid, MD as PCP - General (Family Medicine) Jeannette Corpus, MD as Consulting Physician (Gynecology) Antionette Char, MD as Consulting Physician (Obstetrics and Gynecology)  Inpatient Treatment Team: Treatment Team: Attending Provider: Ardeth Sportsman, MD; Registered Nurse: Hewitt Blade, RN; Technician: Fayrene Helper, NT; Registered Nurse: Herbert Deaner, RN; Technician: Berlinda Last, NT; Technician: Elicia Lamp, NT; Registered Nurse: Christianne Borrow, RN; Registered Nurse: Guy Franco, RN; Technician: Philis Nettle, NT; Technician: Rennis Chris, NT; Registered Nurse: Louie Bun, RN   Results:   Labs: No results found for this or any previous visit (from the past 48 hour(s)).  Imaging / Studies: No results found.  Medications / Allergies: per chart  Antibiotics: Anti-infectives     Start     Dose/Rate Route Frequency Ordered Stop   01/26/12 2200   ceFAZolin (ANCEF) IVPB 2 g/50 mL premix        2 g 100 mL/hr over 30 Minutes Intravenous 3 times per day 01/26/12 1856 01/27/12 0635   01/25/12 1615   ceFAZolin (ANCEF) 3 g in dextrose 5 % 50 mL IVPB        3 g 160 mL/hr over 30 Minutes Intravenous 60 min pre-op 01/25/12 1615 01/26/12 1343

## 2012-01-30 NOTE — Progress Notes (Signed)
Pt requesting something for anxiety.  Vs taken and Dr Michaell Cowing sent page.

## 2012-01-30 NOTE — Progress Notes (Signed)
Pt states that she feels better and is ready to go home.  No further c/o anxiety.

## 2012-01-30 NOTE — Progress Notes (Signed)
Voices  Understanding of d/c instructions no changes noted, script given to pt.

## 2012-01-30 NOTE — Discharge Summary (Signed)
Physician Discharge Summary  Patient ID: Jenna Dillon MRN: [id number] DOB/AGE: 42-Feb-1972 42 y.o.  Admit date: 01/26/2012 Discharge date: 01/30/2012  Admission Diagnoses: Principal Problem:  *Incisional hernia Active Problems:  Obesity, Class III, BMI 40-49.9 (morbid obesity)  Discharge Diagnoses:  Principal Problem:  *Incisional hernia Active Problems:  Obesity, Class III, BMI 40-49.9 (morbid obesity)   Discharged Condition: good  Hospital Course:   Morbidly obese pt with incisional VWH.  Under went lap repair with large sheets of mesh.  Postoperatively, the patient was placed on a bowel regimen.  The patient mobilized and advanced to a solid diet gradually.  Pain control was a challenge at 1st, but became well-controlled and transitioned off IV medications.    By the time of discharge, the patient was walking well the hallways, eating food well, having flatus.  Pain was-controlled on an oral regimen.  Based on meeting DC criteria and recovering well, I felt it was safe for the patient to be discharged home with close followup.  Instructions were discussed in detail with pt & her husband.  They are written as well.     Consults: None  Significant Diagnostic Studies:   Treatments: surgery: Lap LOA & VWH repair  Discharge Exam: Blood pressure 95/62, pulse 82, temperature 98.2 F (36.8 C), temperature source Oral, resp. rate 18, height 5\' 2"  (1.575 m), weight 263 lb 8 oz (119.523 kg), SpO2 98.00%.  General: Pt awake/alert/oriented x4 in no major acute distress Eyes: PERRL, normal EOM. Sclera nonicteric Neuro: CN II-XII intact w/o focal sensory/motor deficits. Lymph: No head/neck/groin lymphadenopathy Psych:  No delerium/psychosis/paranoia HENT: Normocephalic, Mucus membranes moist.  No thrush Neck: Supple, No tracheal deviation Chest: No pain.  Good respiratory excursion. CV:  Pulses intact.  Regular rhythm Abdomen: Soft, Morbidly obese.  Nondistended.  Mildly tender  at incisions.  No incarcerated hernias. Ext:  SCDs BLE.  No significant edema.  No cyanosis Skin: No petechiae / purpurae   Disposition:   Discharge Orders    Future Appointments: Provider: Department: Dept Phone: Center:   02/12/2012 11:30 AM Ardeth Sportsman, MD [facility] [phone] None     Future Orders Please Complete By Expires   Diet - low sodium heart healthy      Increase activity slowly          Medication List     As of 01/30/2012  6:45 AM    TAKE these medications         albuterol (2.5 MG/3ML) 0.083% nebulizer solution   Commonly known as: PROVENTIL   Take 2.5 mg by nebulization as needed. For shortness of breath      albuterol 108 (90 BASE) MCG/ACT inhaler   Commonly known as: PROVENTIL HFA;VENTOLIN HFA   Inhale 2 puffs into the lungs. ProAir brand      ibuprofen 200 MG tablet   Commonly known as: ADVIL,MOTRIN   Take 200 mg by mouth as needed. For pain      oxyCODONE 5 MG immediate release tablet   Commonly known as: Oxy IR/ROXICODONE   Take 1-3 tablets (5-15 mg total) by mouth every 4 (four) hours as needed for pain.           Follow-up Information    Follow up with Alaia Lordi C., MD. Schedule an appointment as soon as possible for a visit in 2 weeks.   Contact information:   [address] [place] [address] [phone]          Signed:  Desmen Schoffstall C. 01/30/2012, 6:45 AM

## 2012-02-02 ENCOUNTER — Telehealth (INDEPENDENT_AMBULATORY_CARE_PROVIDER_SITE_OTHER): Payer: Self-pay

## 2012-02-02 NOTE — Telephone Encounter (Signed)
Patient's husband called and said patient was told by Dr. Michaell Cowing she could remove ON-Q pump when she felt she didn't need anymore. He was making sure that was ok and I assured him it was.

## 2012-02-12 ENCOUNTER — Ambulatory Visit (INDEPENDENT_AMBULATORY_CARE_PROVIDER_SITE_OTHER): Payer: Medicaid Other | Admitting: Surgery

## 2012-02-12 ENCOUNTER — Encounter (INDEPENDENT_AMBULATORY_CARE_PROVIDER_SITE_OTHER): Payer: Self-pay | Admitting: Surgery

## 2012-02-12 VITALS — BP 132/80 | HR 84 | Temp 97.6°F | Resp 20 | Ht 62.0 in | Wt 257.0 lb

## 2012-02-12 DIAGNOSIS — K432 Incisional hernia without obstruction or gangrene: Secondary | ICD-10-CM

## 2012-02-12 NOTE — Patient Instructions (Addendum)
Managing Pain  Pain after surgery or related to activity is often due to strain/injury to muscle, tendon, nerves and/or incisions.  This pain is usually short-term and will improve in a few months.   Many people find it helpful to do the following things TOGETHER to help speed the process of healing and to get back to regular activity more quickly:  1. Avoid heavy physical activity a.  no lifting greater than 20 pounds b. Do not "push through" the pain.  Listen to your body and avoid positions and maneuvers than reproduce the pain c. Walking is okay as tolerated, but go slowly and stop when getting sore.  d. Remember: If it hurts to do it, then don't do it! 2. Take Anti-inflammatory medication  a. Take with food/snack around the clock for 1-2 weeks i. This helps the muscle and nerve tissues become less irritable and calm down faster b. Choose ONE of the following over-the-counter medications: i. Naproxen 220mg tabs (ex. Aleve) 1-2 pills twice a day  ii. Ibuprofen 200mg tabs (ex. Advil, Motrin) 3-4 pills with every meal and just before bedtime iii. Acetaminophen 500mg tabs (Tylenol) 1-2 pills with every meal and just before bedtime 3. Use a Heating pad or Ice/Cold Pack a. 4-6 times a day b. May use warm bath/hottub  or showers 4. Try Gentle Massage and/or Stretching  a. at the area of pain many times a day b. stop if you feel pain - do not overdo it  Try these steps together to help you body heal faster and avoid making things get worse.  Doing just one of these things may not be enough.    If you are not getting better after two weeks or are noticing you are getting worse, contact our office for further advice; we may need to re-evaluate you & see what other things we can do to help.  GETTING TO GOOD BOWEL HEALTH. Irregular bowel habits such as constipation and diarrhea can lead to many problems over time.  Having one soft bowel movement a day is the most important way to prevent  further problems.  The anorectal canal is designed to handle stretching and feces to safely manage our ability to get rid of solid waste (feces, poop, stool) out of our body.  BUT, hard constipated stools can act like ripping concrete bricks and diarrhea can be a burning fire to this very sensitive area of our body, causing inflamed hemorrhoids, anal fissures, increasing risk is perirectal abscesses, abdominal pain/bloating, an making irritable bowel worse.     The goal: ONE SOFT BOWEL MOVEMENT A DAY!  To have soft, regular bowel movements:    Drink at least 8 tall glasses of water a day.     Take plenty of fiber.  Fiber is the undigested part of plant food that passes into the colon, acting s "natures broom" to encourage bowel motility and movement.  Fiber can absorb and hold large amounts of water. This results in a larger, bulkier stool, which is soft and easier to pass. Work gradually over several weeks up to 6 servings a day of fiber (25g a day even more if needed) in the form of: o Vegetables -- Root (potatoes, carrots, turnips), leafy green (lettuce, salad greens, celery, spinach), or cooked high residue (cabbage, broccoli, etc) o Fruit -- Fresh (unpeeled skin & pulp), Dried (prunes, apricots, cherries, etc ),  or stewed ( applesauce)  o Whole grain breads, pasta, etc (whole wheat)  o Bran cereals      Bulking Agents -- This type of water-retaining fiber generally is easily obtained each day by one of the following:  o Psyllium bran -- The psyllium plant is remarkable because its ground seeds can retain so much water. This product is available as Metamucil, Konsyl, Effersyllium, Per Diem Fiber, or the less expensive generic preparation in drug and health food stores. Although labeled a laxative, it really is not a laxative.  o Methylcellulose -- This is another fiber derived from wood which also retains water. It is available as Citrucel. o Polyethylene Glycol - and "artificial" fiber commonly called  Miralax or Glycolax.  It is helpful for people with gassy or bloated feelings with regular fiber o Flax Seed - a less gassy fiber than psyllium   No reading or other relaxing activity while on the toilet. If bowel movements take longer than 5 minutes, you are too constipated   AVOID CONSTIPATION.  High fiber and water intake usually takes care of this.  Sometimes a laxative is needed to stimulate more frequent bowel movements, but    Laxatives are not a good long-term solution as it can wear the colon out. o Osmotics (Milk of Magnesia, Fleets phosphosoda, Magnesium citrate, MiraLax, GoLytely) are safer than  o Stimulants (Senokot, Castor Oil, Dulcolax, Ex Lax)    o Do not take laxatives for more than 7days in a row.    IF SEVERELY CONSTIPATED, try a Bowel Retraining Program: o Do not use laxatives.  o Eat a diet high in roughage, such as bran cereals and leafy vegetables.  o Drink six (6) ounces of prune or apricot juice each morning.  o Eat two (2) large servings of stewed fruit each day.  o Take one (1) heaping tablespoon of a psyllium-based bulking agent twice a day. Use sugar-free sweetener when possible to avoid excessive calories.  o Eat a normal breakfast.  o Set aside 15 minutes after breakfast to sit on the toilet, but do not strain to have a bowel movement.  o If you do not have a bowel movement by the third day, use an enema and repeat the above steps.    Controlling diarrhea o Switch to liquids and simpler foods for a few days to avoid stressing your intestines further. o Avoid dairy products (especially milk & ice cream) for a short time.  The intestines often can lose the ability to digest lactose when stressed. o Avoid foods that cause gassiness or bloating.  Typical foods include beans and other legumes, cabbage, broccoli, and dairy foods.  Every person has some sensitivity to other foods, so listen to our body and avoid those foods that trigger problems for you. o Adding fiber  (Citrucel, Metamucil, psyllium, Miralax) gradually can help thicken stools by absorbing excess fluid and retrain the intestines to act more normally.  Slowly increase the dose over a few weeks.  Too much fiber too soon can backfire and cause cramping & bloating. o Probiotics (such as active yogurt, Align, etc) may help repopulate the intestines and colon with normal bacteria and calm down a sensitive digestive tract.  Most studies show it to be of mild help, though, and such products can be costly. o Medicines:   Bismuth subsalicylate (ex. Kayopectate, Pepto Bismol) every 30 minutes for up to 6 doses can help control diarrhea.  Avoid if pregnant.   Loperamide (Immodium) can slow down diarrhea.  Start with two tablets (4mg total) first and then try one tablet every 6 hours.  Avoid if you   are having fevers or severe pain.  If you are not better or start feeling worse, stop all medicines and call your doctor for advice o Call your doctor if you are getting worse or not better.  Sometimes further testing (cultures, endoscopy, X-ray studies, bloodwork, etc) may be needed to help diagnose and treat the cause of the diarrhea.  Hernia A hernia occurs when an internal organ pushes out through a weak spot in the abdominal wall. Hernias most commonly occur in the groin and around the navel. Hernias often can be pushed back into place (reduced). Most hernias tend to get worse over time. Some abdominal hernias can get stuck in the opening (irreducible or incarcerated hernia) and cannot be reduced. An irreducible abdominal hernia which is tightly squeezed into the opening is at risk for impaired blood supply (strangulated hernia). A strangulated hernia is a medical emergency. Because of the risk for an irreducible or strangulated hernia, surgery may be recommended to repair a hernia. CAUSES   Heavy lifting.  Prolonged coughing.  Straining to have a bowel movement.  A cut (incision) made during an abdominal  surgery. HOME CARE INSTRUCTIONS   Bed rest is not required. You may continue your normal activities.  Avoid lifting more than 10 pounds (4.5 kg) or straining.  Cough gently. If you are a smoker it is best to stop. Even the best hernia repair can break down with the continual strain of coughing. Even if you do not have your hernia repaired, a cough will continue to aggravate the problem.  Do not wear anything tight over your hernia. Do not try to keep it in with an outside bandage or truss. These can damage abdominal contents if they are trapped within the hernia sac.  Eat a normal diet.  Avoid constipation. Straining over long periods of time will increase hernia size and encourage breakdown of repairs. If you cannot do this with diet alone, stool softeners may be used. SEEK IMMEDIATE MEDICAL CARE IF:   You have a fever.  You develop increasing abdominal pain.  You feel nauseous or vomit.  Your hernia is stuck outside the abdomen, looks discolored, feels hard, or is tender.  You have any changes in your bowel habits or in the hernia that are unusual for you.  You have increased pain or swelling around the hernia.  You cannot push the hernia back in place by applying gentle pressure while lying down. MAKE SURE YOU:   Understand these instructions.  Will watch your condition.  Will get help right away if you are not doing well or get worse. Document Released: 04/03/2005 Document Revised: 06/26/2011 Document Reviewed: 11/21/2007 [facility] Patient Information 2013 [place], [place].  Exercise to Lose Weight Exercise and a healthy diet may help you lose weight. Your doctor may suggest specific exercises. EXERCISE IDEAS AND TIPS  Choose low-cost things you enjoy doing, such as walking, bicycling, or exercising to workout videos.  Take stairs instead of the elevator.  Walk during your lunch break.  Park your car further away from work or school.  Go to a gym or an exercise  class.  Start with 5 to 10 minutes of exercise each day. Build up to 30 minutes of exercise 4 to 6 days a week.  Wear shoes with good support and comfortable clothes.  Stretch before and after working out.  Work out until you breathe harder and your heart beats faster.  Drink extra water when you exercise.  Do not do so much  that you hurt yourself, feel dizzy, or get very short of breath. Exercises that burn about 150 calories:  Running 1  miles in 15 minutes.  Playing volleyball for 45 to 60 minutes.  Washing and waxing a car for 45 to 60 minutes.  Playing touch football for 45 minutes.  Walking 1  miles in 35 minutes.  Pushing a stroller 1  miles in 30 minutes.  Playing basketball for 30 minutes.  Raking leaves for 30 minutes.  Bicycling 5 miles in 30 minutes.  Walking 2 miles in 30 minutes.  Dancing for 30 minutes.  Shoveling snow for 15 minutes.  Swimming laps for 20 minutes.  Walking up stairs for 15 minutes.  Bicycling 4 miles in 15 minutes.  Gardening for 30 to 45 minutes.  Jumping rope for 15 minutes.  Washing windows or floors for 45 to 60 minutes. Document Released: 05/06/2010 Document Revised: 06/26/2011 Document Reviewed: 05/06/2010 [facility] Patient Information 2013 [place], [place].  Calorie Counting Diet A calorie counting diet requires you to eat the number of calories that are right for you in a day. Calories are the measurement of how much energy you get from the food you eat. Eating the right amount of calories is important for staying at a healthy weight. If you eat too many calories, your body will store them as fat and you may gain weight. If you eat too few calories, you may lose weight. Counting the number of calories you eat during a day will help you know if you are eating the right amount. A Registered Dietitian can determine how many calories you need in a day. The amount of calories needed varies from person to person. If your goal  is to lose weight, you will need to eat fewer calories. Losing weight can benefit you if you are overweight or have health problems such as heart disease, high blood pressure, or diabetes. If your goal is to gain weight, you will need to eat more calories. Gaining weight may be necessary if you have a certain health problem that causes your body to need more energy. TIPS Whether you are increasing or decreasing the number of calories you eat during a day, it may be hard to get used to changes in what you eat and drink. The following are tips to help you keep track of the number of calories you eat.  Measure foods at home with measuring cups. This helps you know the amount of food and number of calories you are eating.  Restaurants often serve food in amounts that are larger than 1 serving. While eating out, estimate how many servings of a food you are given. For example, a serving of cooked rice is  cup or about the size of half of a fist. Knowing serving sizes will help you be aware of how much food you are eating at restaurants.  Ask for smaller portion sizes or child-size portions at restaurants.  Plan to eat half of a meal at a restaurant. Take the rest home or share the other half with a friend.  Read the Nutrition Facts panel on food labels for calorie content and serving size. You can find out how many servings are in a package, the size of a serving, and the number of calories each serving has.  For example, a package might contain 3 cookies. The Nutrition Facts panel on that package says that 1 serving is 1 cookie. Below that, it will say there are 3 servings in the  container. The calories section of the Nutrition Facts label says there are 90 calories. This means there are 90 calories in 1 cookie (1 serving). If you eat 1 cookie you have eaten 90 calories. If you eat all 3 cookies, you have eaten 270 calories (3 servings x 90 calories = 270 calories). The list below tells you how big or  small some common portion sizes are.  1 oz.........4 stacked dice.  3 oz........Marland KitchenDeck of cards.  1 tsp.......Marland KitchenTip of little finger.  1 tbs......Marland KitchenMarland KitchenThumb.  2 tbs.......Marland KitchenGolf ball.   cup......Marland KitchenHalf of a fist.  1 cup.......Marland KitchenA fist. KEEP A FOOD LOG Write down every food item you eat, the amount you eat, and the number of calories in each food you eat during the day. At the end of the day, you can add up the total number of calories you have eaten. It may help to keep a list like the one below. Find out the calorie information by reading the Nutrition Facts panel on food labels. Breakfast  Bran cereal (1 cup, 110 calories).  Fat-free milk ( cup, 45 calories). Snack  Apple (1 medium, 80 calories). Lunch  Spinach (1 cup, 20 calories).  Tomato ( medium, 20 calories).  Chicken breast strips (3 oz, 165 calories).  Shredded cheddar cheese ( cup, 110 calories).  Light [place] dressing (2 tbs, 60 calories).  Whole-wheat bread (1 slice, 80 calories).  Tub margarine (1 tsp, 35 calories).  Vegetable soup (1 cup, 160 calories). Dinner  Pork chop (3 oz, 190 calories).  Brown rice (1 cup, 215 calories).  Steamed broccoli ( cup, 20 calories).  Strawberries (1  cup, 65 calories).  Whipped cream (1 tbs, 50 calories). Daily Calorie Total: 1425 Document Released: 04/03/2005 Document Revised: 06/26/2011 Document Reviewed: 09/28/2006 [facility] Patient Information 2013 [place], [place].

## 2012-02-12 NOTE — Progress Notes (Signed)
Subjective:     Patient ID: Jenna Dillon, female   DOB: August 12, 1970, 41 y.o.   MRN: [id number]  HPI  JALEISHA BALOGA  1971/02/15 [id number]  Patient Care Team: Leighton Roach McDiarmid, MD as PCP - General (Family Medicine) Jeannette Corpus, MD as Consulting Physician (Gynecology) Antionette Char, MD as Consulting Physician (Obstetrics and Gynecology)  This patient is a 41 y.o.female who presents today for surgical evaluation.   Patient comes in today with her husband.  She is now two weeks status post laparoscopic repair of large ventral wall incisional hernias.  Completed narcotics.  Was constipated on that.  Took no current medication and had some loose bowel movements.  Trying to use Metamucil and stool softeners.  Using ibuprofen only.  Seems to control pain for the most part.  Starting to get more active and walk more.  Avoiding sweets and trying to lose weight.  Has lost 5 pounds since surgery.  No nausea or vomiting.  No fevers or chills.  Patient Active Problem List  Diagnosis  . Obesity, Class III, BMI 40-49.9 (morbid obesity)  . PANIC ATTACKS, History of  . HOT FLASHES  . Incisional hernia    Past Medical History  Diagnosis Date  . Obesity, Class III, BMI 40-49.9 (morbid obesity) 06/14/2006    Qualifier: Diagnosis of  By: McDiarmid MD, Tawanna Cooler    . PANIC ATTACKS, History of 06/14/2006    Qualifier: History of  By: McDiarmid MD, Tawanna Cooler    . Shortness of breath 01-24-12    2'13-Pneumonia-has uses ProAir as needed for SOB.wheezing.  . Pneumonia   . GERD (gastroesophageal reflux disease) 01-24-12    no meds, controls with diet    Past Surgical History  Procedure Date  . Oophorectomy     Right salpingo-oophorectomy,    . Laparoscopic cholecystectomy ~1999  . Laparoscopic tubal ligation 2003    Dr Antionette Char  . Abdominal hysterectomy 04/2007    Total abdominal hysterectomy, right salpingo-oophorectomy,  left salpingectomy  . Ventral hernia repair 01/26/2012   Procedure: LAPAROSCOPIC VENTRAL HERNIA;  Surgeon: Ardeth Sportsman, MD;  Location: [facility];  Service: General;  Laterality: N/A;  Laparoscopic Incarcerated Ventral Hernia Repair x 4 with Mesh    History   Social History  . Marital Status: Married    Spouse Name: N/A    Number of Children: N/A  . Years of Education: N/A   Occupational History  . Not on file.   Social History Main Topics  . Smoking status: Former Games developer  . Smokeless tobacco: Former Neurosurgeon    Quit date: 01/23/2001  . Alcohol Use: No  . Drug Use: No  . Sexually Active: Yes   Other Topics Concern  . Not on file   Social History Narrative  . No narrative on file    Family History  Problem Relation Age of Onset  . Heart disease Mother   . Cancer Father     lung     Current Outpatient Prescriptions  Medication Sig Dispense Refill  . albuterol (PROVENTIL HFA;VENTOLIN HFA) 108 (90 BASE) MCG/ACT inhaler Inhale 2 puffs into the lungs. ProAir brand      . albuterol (PROVENTIL) (2.5 MG/3ML) 0.083% nebulizer solution Take 2.5 mg by nebulization as needed. For shortness of breath      . ibuprofen (ADVIL,MOTRIN) 200 MG tablet Take 200 mg by mouth as needed. For pain         Allergies  Allergen Reactions  . Sulfamethoxazole Rash  ictching    BP 132/80  Pulse 84  Temp 97.6 F (36.4 C) (Temporal)  Resp 20  Ht 5\' 2"  (1.575 m)  Wt 257 lb (116.574 kg)  BMI 47.01 kg/m2  No results found.   Review of Systems  Constitutional: Negative for fever, chills and diaphoresis.  HENT: Negative for ear pain, sore throat and trouble swallowing.   Eyes: Negative for photophobia and visual disturbance.  Respiratory: Negative for cough and choking.   Cardiovascular: Negative for chest pain and palpitations.  Gastrointestinal: Negative for nausea, vomiting, diarrhea, constipation, abdominal distention, anal bleeding and rectal pain.       Mild soreness LUQ/flank  Genitourinary: Negative for dysuria, frequency and difficulty  urinating.  Musculoskeletal: Negative for myalgias and gait problem.  Skin: Negative for color change, pallor and rash.  Neurological: Negative for dizziness, speech difficulty, weakness and numbness.  Hematological: Negative for adenopathy.  Psychiatric/Behavioral: Negative for confusion and agitation. The patient is not nervous/anxious.        Objective:   Physical Exam  Constitutional: She is oriented to person, place, and time. She appears well-developed and well-nourished. No distress.  HENT:  Head: Normocephalic.  Mouth/Throat: Oropharynx is clear and moist. No oropharyngeal exudate.  Eyes: Conjunctivae normal and EOM are normal. Pupils are equal, round, and reactive to light. No scleral icterus.  Neck: Normal range of motion. No tracheal deviation present.  Cardiovascular: Normal rate and intact distal pulses.   Pulmonary/Chest: Effort normal. No respiratory distress. She exhibits no tenderness.  Abdominal: Soft. She exhibits no distension and no mass. There is no rebound and no guarding. Hernia confirmed negative in the right inguinal area and confirmed negative in the left inguinal area.       Mild L flank soreness.  Incisions clean with normal healing ridges.  No hernias  Genitourinary: No vaginal discharge found.  Musculoskeletal: Normal range of motion. She exhibits no tenderness.  Lymphadenopathy:       Right: No inguinal adenopathy present.       Left: No inguinal adenopathy present.  Neurological: She is alert and oriented to person, place, and time. No cranial nerve deficit. She exhibits normal muscle tone. Coordination normal.  Skin: Skin is warm and dry. No rash noted. She is not diaphoretic.  Psychiatric: She has a normal mood and affect. Her behavior is normal.       Assessment:     Doing relatively well 2 weeks s/p laparoscopic repair of large ventral wall hernias in a morbidly obese female.    Plan:     Increase activity as tolerated to regular activity.   Do not push through pain.Heat and ibuprofen round the clock.  I offered to refill narcotics but she declined.  She will call if she needs them.  Diet as tolerated. Bowel regimen to avoid problems.  Increase Metamucil if you need narcotics.  Return to clinic 3-4 weeks, p.r.n if much better.   Instructions discussed.  Followup with primary care physician for other health issues as would normally be done.  Questions answered.  The patient expressed understanding and appreciation

## 2012-02-15 ENCOUNTER — Ambulatory Visit: Payer: Medicaid Other | Admitting: Family Medicine

## 2012-02-19 ENCOUNTER — Telehealth (INDEPENDENT_AMBULATORY_CARE_PROVIDER_SITE_OTHER): Payer: Self-pay | Admitting: General Surgery

## 2012-02-19 NOTE — Telephone Encounter (Signed)
Pt called to report blood in stool.  Her surgery was on 01/26/12 for hernia repair.  She states she is off all pain meds, is having regular bowel movements but sometimes they are hard.  The last 2 BMs have had bright red blood and were very hard.  Advised she increase her po fluid intake, consider adding a stool softener and to call her PCP if bloody stools persist.  She understands and will comply

## 2012-03-06 ENCOUNTER — Ambulatory Visit (INDEPENDENT_AMBULATORY_CARE_PROVIDER_SITE_OTHER): Payer: Medicaid Other | Admitting: Surgery

## 2012-03-06 ENCOUNTER — Encounter (INDEPENDENT_AMBULATORY_CARE_PROVIDER_SITE_OTHER): Payer: Self-pay | Admitting: Surgery

## 2012-03-06 VITALS — BP 124/80 | HR 90 | Temp 97.4°F | Ht 62.0 in | Wt 257.8 lb

## 2012-03-06 DIAGNOSIS — K432 Incisional hernia without obstruction or gangrene: Secondary | ICD-10-CM

## 2012-03-06 NOTE — Progress Notes (Signed)
Subjective:     Patient ID: Jenna Dillon, female   DOB: Jul 03, 1970, 41 y.o.   MRN: [id number]  HPI   MARRI MCNEFF  1970/07/09 [id number]  Patient Care Team: Leighton Roach McDiarmid, MD as PCP - General (Family Medicine) Jeannette Corpus, MD as Consulting Physician (Gynecology) Antionette Char, MD as Consulting Physician (Obstetrics and Gynecology)  This patient is a 41 y.o.female who presents today for surgical evaluation.   Patient comes in today with her husband.  She is now 5 weeks status post laparoscopic repair of large ventral wall incisional hernias.  Soreness markedly down.  Some pull of left lateral costal margin & mild numbness left lateral thigh.   Using ibuprofen only.    Walking well. No nausea or vomiting.  No fevers or chills.  Still with occasional hard stools / constipation.  Taking "stool softeners" and MOM for that  Patient Active Problem List  Diagnosis  . Obesity, Class III, BMI 40-49.9 (morbid obesity)  . PANIC ATTACKS, History of  . HOT FLASHES  . Incisional hernia    Past Medical History  Diagnosis Date  . Obesity, Class III, BMI 40-49.9 (morbid obesity) 06/14/2006    Qualifier: Diagnosis of  By: McDiarmid MD, Tawanna Cooler    . PANIC ATTACKS, History of 06/14/2006    Qualifier: History of  By: McDiarmid MD, Tawanna Cooler    . Shortness of breath 01-24-12    2'13-Pneumonia-has uses ProAir as needed for SOB.wheezing.  . Pneumonia   . GERD (gastroesophageal reflux disease) 01-24-12    no meds, controls with diet    Past Surgical History  Procedure Date  . Oophorectomy     Right salpingo-oophorectomy,    . Laparoscopic cholecystectomy ~1999  . Laparoscopic tubal ligation 2003    Dr Antionette Char  . Abdominal hysterectomy 04/2007    Total abdominal hysterectomy, right salpingo-oophorectomy,  left salpingectomy  . Ventral hernia repair 01/26/2012    Procedure: LAPAROSCOPIC VENTRAL HERNIA;  Surgeon: Ardeth Sportsman, MD;  Location: [facility];  Service: General;   Laterality: N/A;  Laparoscopic Incarcerated Ventral Hernia Repair x 4 with Mesh  . Hernia repair     History   Social History  . Marital Status: Married    Spouse Name: N/A    Number of Children: N/A  . Years of Education: N/A   Occupational History  . Not on file.   Social History Main Topics  . Smoking status: Former Games developer  . Smokeless tobacco: Former Neurosurgeon    Quit date: 01/23/2001  . Alcohol Use: No  . Drug Use: No  . Sexually Active: Yes   Other Topics Concern  . Not on file   Social History Narrative  . No narrative on file    Family History  Problem Relation Age of Onset  . Heart disease Mother   . Cancer Father     lung     Current Outpatient Prescriptions  Medication Sig Dispense Refill  . albuterol (PROVENTIL HFA;VENTOLIN HFA) 108 (90 BASE) MCG/ACT inhaler Inhale 2 puffs into the lungs. ProAir brand      . docusate sodium (COLACE) 100 MG capsule Take 100 mg by mouth 2 (two) times daily.      Marland Kitchen ibuprofen (ADVIL,MOTRIN) 200 MG tablet Take 200 mg by mouth as needed. For pain         Allergies  Allergen Reactions  . Sulfamethoxazole Rash    ictching    BP 124/80  Pulse 90  Temp 97.4 F (36.3  C) (Temporal)  Ht 5\' 2"  (1.575 m)  Wt 257 lb 12.8 oz (116.937 kg)  BMI 47.15 kg/m2  SpO2 98%  No results found.   Review of Systems  Constitutional: Negative for fever, chills and diaphoresis.  HENT: Negative for ear pain, sore throat and trouble swallowing.   Eyes: Negative for photophobia and visual disturbance.  Respiratory: Negative for cough and choking.   Cardiovascular: Negative for chest pain and palpitations.  Gastrointestinal: Negative for nausea, vomiting, diarrhea, constipation, abdominal distention, anal bleeding and rectal pain.       Mild soreness LUQ/flank  Genitourinary: Negative for dysuria, frequency and difficulty urinating.  Musculoskeletal: Negative for myalgias and gait problem.  Skin: Negative for color change, pallor and rash.    Neurological: Negative for dizziness, speech difficulty, weakness and numbness.  Hematological: Negative for adenopathy.  Psychiatric/Behavioral: Negative for confusion and agitation. The patient is not nervous/anxious.        Objective:   Physical Exam  Constitutional: She is oriented to person, place, and time. She appears well-developed and well-nourished. No distress.  HENT:  Head: Normocephalic.  Mouth/Throat: Oropharynx is clear and moist. No oropharyngeal exudate.  Eyes: Conjunctivae normal and EOM are normal. Pupils are equal, round, and reactive to light. No scleral icterus.  Neck: Normal range of motion. No tracheal deviation present.  Cardiovascular: Normal rate and intact distal pulses.   Pulmonary/Chest: Effort normal. No respiratory distress. She exhibits no tenderness.  Abdominal: Soft. She exhibits no distension and no mass. There is no rebound and no guarding. Hernia confirmed negative in the right inguinal area and confirmed negative in the left inguinal area.       Mild L flank soreness.  Incisions clean with normal healing ridges.  No hernias  Genitourinary: No vaginal discharge found.  Musculoskeletal: Normal range of motion. She exhibits no tenderness.  Lymphadenopathy:       Right: No inguinal adenopathy present.       Left: No inguinal adenopathy present.  Neurological: She is alert and oriented to person, place, and time. No cranial nerve deficit. She exhibits normal muscle tone. Coordination normal.  Skin: Skin is warm and dry. No rash noted. She is not diaphoretic.  Psychiatric: She has a normal mood and affect. Her behavior is normal.       Assessment:     Doing better 5 weeks s/p laparoscopic repair of large ventral wall hernias in a morbidly obese female.    Plan:     Increase activity as tolerated to regular activity.  Do not push through pain.  Heat and ibuprofen As needed for pain control.  I noted he can take several months for these pains and  poles to eventually go I, especially given her morbid obesity and needng large mesh repairs.   Diet as tolerated. Bowel regimen to avoid problems. Stop stool softeners and milk of magnesia.  Stick to Metamucil or MiraLAX and gradually increase until moving bowels more regularly.  Try to lose weight.  Her morbid obesity puts hernia recurrence risk high despite using giant sheets of mesh to compensate for that.  She is working to try to do this gradually..  Return to clinic p.r.n since much better.   Instructions discussed.  Followup with primary care physician for other health issues as would normally be done.  Questions answered.  The patient expressed understanding and appreciation

## 2012-03-06 NOTE — Patient Instructions (Signed)
Managing Pain  Pain after surgery or related to activity is often due to strain/injury to muscle, tendon, nerves and/or incisions.  This pain is usually short-term and will improve in a few months.   Many people find it helpful to do the following things TOGETHER to help speed the process of healing and to get back to regular activity more quickly:  1. Avoid heavy physical activity a.  no lifting greater than 20 pounds b. Do not "push through" the pain.  Listen to your body and avoid positions and maneuvers than reproduce the pain c. Walking is okay as tolerated, but go slowly and stop when getting sore.  d. Remember: If it hurts to do it, then don't do it! 2. Take Anti-inflammatory medication  a. Take with food/snack around the clock for 1-2 weeks i. This helps the muscle and nerve tissues become less irritable and calm down faster b. Choose ONE of the following over-the-counter medications: i. Naproxen 220mg  tabs (ex. Aleve) 1-2 pills twice a day  ii. Ibuprofen 200mg  tabs (ex. Advil, Motrin) 3-4 pills with every meal and just before bedtime iii. Acetaminophen 500mg  tabs (Tylenol) 1-2 pills with every meal and just before bedtime 3. Use a Heating pad or Ice/Cold Pack a. 4-6 times a day b. May use warm bath/hottub  or showers 4. Try Gentle Massage and/or Stretching  a. at the area of pain many times a day b. stop if you feel pain - do not overdo it  Try these steps together to help you body heal faster and avoid making things get worse.  Doing just one of these things may not be enough.    If you are not getting better after two weeks or are noticing you are getting worse, contact our office for further advice; we may need to re-evaluate you & see what other things we can do to help.  Managing Pain  Pain after surgery or related to activity is often due to strain/injury to muscle, tendon, nerves and/or incisions.  This pain is usually short-term and will improve in a few months.    Many people find it helpful to do the following things TOGETHER to help speed the process of healing and to get back to regular activity more quickly:  5. Avoid heavy physical activity a.  no lifting greater than 20 pounds b. Do not "push through" the pain.  Listen to your body and avoid positions and maneuvers than reproduce the pain c. Walking is okay as tolerated, but go slowly and stop when getting sore.  d. Remember: If it hurts to do it, then don't do it! 6. Take Anti-inflammatory medication  a. Take with food/snack around the clock for 1-2 weeks i. This helps the muscle and nerve tissues become less irritable and calm down faster b. Choose ONE of the following over-the-counter medications: i. Naproxen 220mg  tabs (ex. Aleve) 1-2 pills twice a day  ii. Ibuprofen 200mg  tabs (ex. Advil, Motrin) 3-4 pills with every meal and just before bedtime iii. Acetaminophen 500mg  tabs (Tylenol) 1-2 pills with every meal and just before bedtime 7. Use a Heating pad or Ice/Cold Pack a. 4-6 times a day b. May use warm bath/hottub  or showers 8. Try Gentle Massage and/or Stretching  a. at the area of pain many times a day b. stop if you feel pain - do not overdo it  Try these steps together to help you body heal faster and avoid making things get worse.  Doing just one  of these things may not be enough.    If you are not getting better after two weeks or are noticing you are getting worse, contact our office for further advice; we may need to re-evaluate you & see what other things we can do to help.

## 2012-08-01 ENCOUNTER — Encounter: Payer: Medicaid Other | Admitting: Family Medicine

## 2012-08-22 ENCOUNTER — Encounter: Payer: Medicaid Other | Admitting: Family Medicine

## 2013-02-20 ENCOUNTER — Other Ambulatory Visit: Payer: Self-pay

## 2014-02-20 ENCOUNTER — Ambulatory Visit: Payer: Medicaid Other | Admitting: *Deleted

## 2014-02-20 ENCOUNTER — Ambulatory Visit (INDEPENDENT_AMBULATORY_CARE_PROVIDER_SITE_OTHER): Payer: Medicaid Other | Admitting: *Deleted

## 2014-02-20 DIAGNOSIS — Z23 Encounter for immunization: Secondary | ICD-10-CM

## 2014-09-10 ENCOUNTER — Encounter: Payer: Self-pay | Admitting: Family Medicine

## 2014-09-10 ENCOUNTER — Encounter: Payer: Medicaid Other | Admitting: Family Medicine

## 2015-12-23 ENCOUNTER — Telehealth: Payer: Self-pay | Admitting: Family Medicine

## 2015-12-23 ENCOUNTER — Ambulatory Visit (INDEPENDENT_AMBULATORY_CARE_PROVIDER_SITE_OTHER): Payer: Medicaid Other | Admitting: Family Medicine

## 2015-12-23 ENCOUNTER — Encounter: Payer: Self-pay | Admitting: Family Medicine

## 2015-12-23 VITALS — BP 142/70 | HR 67 | Temp 98.6°F | Ht 62.0 in | Wt 215.0 lb

## 2015-12-23 DIAGNOSIS — R634 Abnormal weight loss: Secondary | ICD-10-CM

## 2015-12-23 DIAGNOSIS — E7800 Pure hypercholesterolemia, unspecified: Secondary | ICD-10-CM | POA: Diagnosis not present

## 2015-12-23 DIAGNOSIS — Z23 Encounter for immunization: Secondary | ICD-10-CM

## 2015-12-23 DIAGNOSIS — E669 Obesity, unspecified: Secondary | ICD-10-CM

## 2015-12-23 DIAGNOSIS — R300 Dysuria: Secondary | ICD-10-CM | POA: Diagnosis not present

## 2015-12-23 DIAGNOSIS — Z114 Encounter for screening for human immunodeficiency virus [HIV]: Secondary | ICD-10-CM

## 2015-12-23 DIAGNOSIS — Z13220 Encounter for screening for lipoid disorders: Secondary | ICD-10-CM

## 2015-12-23 LAB — LIPID PANEL
CHOLESTEROL: 173 mg/dL (ref 125–200)
HDL: 58 mg/dL (ref >=46)
LDL Cholesterol: 100 mg/dL (ref <130)
Total CHOL/HDL Ratio: 3 Ratio (ref <=5.0)
Triglycerides: 75 mg/dL (ref <150)
VLDL: 15 mg/dL (ref <30)

## 2015-12-23 LAB — POCT URINALYSIS DIPSTICK
Bilirubin, UA: NEGATIVE
Glucose, UA: NEGATIVE
Ketones, UA: NEGATIVE
NITRITE UA: NEGATIVE
SPEC GRAV UA: 1.02
UROBILINOGEN UA: 1
pH, UA: 6

## 2015-12-23 LAB — BASIC METABOLIC PANEL
BUN: 18 mg/dL (ref 7–25)
CALCIUM: 8.9 mg/dL (ref 8.6–10.2)
CHLORIDE: 109 mmol/L (ref 98–110)
CO2: 25 mmol/L (ref 20–31)
Creat: 0.8 mg/dL (ref 0.50–1.10)
Glucose, Bld: 81 mg/dL (ref 65–99)
Potassium: 3.8 mmol/L (ref 3.5–5.3)
SODIUM: 142 mmol/L (ref 135–146)

## 2015-12-23 LAB — POCT UA - MICROSCOPIC ONLY: WBC, Ur, HPF, POC: >20

## 2015-12-23 MED ORDER — CEPHALEXIN 500 MG PO CAPS: 500.0000 mg | ORAL_CAPSULE | Freq: Four times a day (QID) | ORAL | 0 refills | Status: AC

## 2015-12-23 NOTE — Telephone Encounter (Signed)
(+)   dysuria, frequency and pyuria Rx Keflex 500 mg QID oral for 5 days sent to her pharmacy

## 2015-12-23 NOTE — Progress Notes (Signed)
   Subjective:    Patient ID: Jenna Dillon, female    DOB: 03/22/1971, 45 y.o.   MRN: [id number]  HPI  CC: concern about cholesterol  Hypercholesterolemia - longstanding problem - Dieting with nearly 50 pounds of weight loss thru diet and exercise - No chest pain, no claudication   Obesity Patient complains of obesity. Patient cites health as reasons for wanting to lose weight. Cutting down on sweets, eating more vegetable, exerciseing on a recline bike three times a week regularly Avoiding fast food. No alcohol.  Drinking 6 to 8 glasses of water a day.   SH: no smoking  Review of Systems  No limb wekness No difficulty speech    Objective:   Physical Exam VS reviewed GEN: Alert, Cooperative, Groomed, NAD HEENT: PERRL; EAC bilaterally not occluded, TM's translucent with normal LM, (+) LR;                No cervical LAN, No thyromegaly, No palpable masses COR: RRR, No M/G/R, No JVD, Normal PMI size and location LUNGS: BCTA, No Acc mm use, speaking in full sentences ABDOMEN: (+)BS, soft, NT, ND, No HSM, No palpable masses EXT: No peripheral leg edema. Feet without deformity or lesions. Palpable bilateral pedal pulses.  SKIN: No lesion nor rashes of face/trunk/extremities Neuro: Oriented to person, place, and time; Strength: 5/5 Bil. UE and LE symmetric; Sensation: Intact grossly to touch all four extremities; Cerebellar: Finger-to-Nose intact, Rhomberg negative Gait: Normal speed, No significant path deviation, Step through +,  Psych: Normal affect/thought/speech/language        Assessment & Plan:

## 2015-12-23 NOTE — Assessment & Plan Note (Signed)
Established problem Controlled Checking lipid panel. Significant intentional weight loss by patient

## 2015-12-23 NOTE — Progress Notes (Signed)
Dysuria Onset two to three days ago, I Has not tried anything Many years since last UTI No abdominal nor flank pain Denies fever, urgency,  (+) frequency No nausea/vomiting

## 2015-12-23 NOTE — Patient Instructions (Addendum)
I am so proud of you.  You are doing doing great.  Keep it up.  Dr Seraiah Nowack will call you if your tests are not good. Otherwise he will send you a letter.  If you sign up for MyChart online, you will be able to see your test results once Dr Dina Warbington has reviewed them.  If you do not hear from us with in 2 weeks please call our office

## 2015-12-23 NOTE — Progress Notes (Signed)
Patient ID: Jenna FieldMiriam T Dillon, female   DOB: 01/04/1971, 45 y.o.   MRN: [id number]

## 2015-12-23 NOTE — Assessment & Plan Note (Signed)
Established problem that has improved.  Patient working actively on diet and exercise.

## 2015-12-24 LAB — HIV ANTIBODY (ROUTINE TESTING W REFLEX): HIV 1&2 Ab, 4th Generation: NONREACTIVE

## 2015-12-28 ENCOUNTER — Encounter: Payer: Self-pay | Admitting: Family Medicine

## 2017-07-09 ENCOUNTER — Encounter (HOSPITAL_COMMUNITY): Payer: Self-pay

## 2017-07-09 ENCOUNTER — Other Ambulatory Visit: Payer: Self-pay

## 2017-07-09 ENCOUNTER — Inpatient Hospital Stay (HOSPITAL_COMMUNITY)
Admission: AD | Admit: 2017-07-09 | Discharge: 2017-07-12 | DRG: 881 | Disposition: A | Discharge status: Home / Self Care | Payer: Medicaid Other | Source: Intra-hospital | Attending: Psychiatry | Admitting: Psychiatry

## 2017-07-09 DIAGNOSIS — G4700 Insomnia, unspecified: Secondary | ICD-10-CM | POA: Diagnosis present

## 2017-07-09 DIAGNOSIS — E7800 Pure hypercholesterolemia, unspecified: Secondary | ICD-10-CM | POA: Diagnosis present

## 2017-07-09 DIAGNOSIS — Z87891 Personal history of nicotine dependence: Secondary | ICD-10-CM

## 2017-07-09 DIAGNOSIS — F3342 Major depressive disorder, recurrent, in full remission: Secondary | ICD-10-CM

## 2017-07-09 DIAGNOSIS — F329 Major depressive disorder, single episode, unspecified: Principal | ICD-10-CM | POA: Diagnosis present

## 2017-07-09 DIAGNOSIS — F419 Anxiety disorder, unspecified: Secondary | ICD-10-CM | POA: Diagnosis not present

## 2017-07-09 DIAGNOSIS — Z6839 Body mass index (BMI) 39.0-39.9, adult: Secondary | ICD-10-CM

## 2017-07-09 DIAGNOSIS — K219 Gastro-esophageal reflux disease without esophagitis: Secondary | ICD-10-CM | POA: Diagnosis present

## 2017-07-09 DIAGNOSIS — F411 Generalized anxiety disorder: Secondary | ICD-10-CM | POA: Diagnosis present

## 2017-07-09 DIAGNOSIS — Z6379 Other stressful life events affecting family and household: Secondary | ICD-10-CM | POA: Diagnosis not present

## 2017-07-09 DIAGNOSIS — Z23 Encounter for immunization: Secondary | ICD-10-CM

## 2017-07-09 DIAGNOSIS — E669 Obesity, unspecified: Secondary | ICD-10-CM | POA: Diagnosis present

## 2017-07-09 DIAGNOSIS — F17210 Nicotine dependence, cigarettes, uncomplicated: Secondary | ICD-10-CM | POA: Diagnosis not present

## 2017-07-09 DIAGNOSIS — R4587 Impulsiveness: Secondary | ICD-10-CM | POA: Diagnosis not present

## 2017-07-09 DIAGNOSIS — F332 Major depressive disorder, recurrent severe without psychotic features: Secondary | ICD-10-CM | POA: Diagnosis not present

## 2017-07-09 HISTORY — DX: Major depressive disorder, recurrent, in full remission: F33.42

## 2017-07-09 MED ORDER — MAGNESIUM HYDROXIDE 400 MG/5ML PO SUSP: 30.0000 mL | Freq: Every day | ORAL | Status: DC | PRN

## 2017-07-09 MED ORDER — TRAZODONE HCL 50 MG PO TABS
50.0000 mg | ORAL_TABLET | Freq: Every evening | ORAL | Status: DC | PRN
  Administered 2017-07-09 – 2017-07-11 (×3): 50 mg via ORAL
  Filled 2017-07-09 (×3): qty 1

## 2017-07-09 MED ORDER — ACETAMINOPHEN 325 MG PO TABS
650.0000 mg | ORAL_TABLET | Freq: Four times a day (QID) | ORAL | Status: DC | PRN
  Administered 2017-07-09 – 2017-07-10 (×3): 650 mg via ORAL
  Filled 2017-07-09 (×3): qty 2
  Filled 2017-07-09: qty 1

## 2017-07-09 MED ORDER — INFLUENZA VAC SPLIT QUAD 0.5 ML IM SUSY
0.5000 mL | PREFILLED_SYRINGE | INTRAMUSCULAR | Status: AC
  Administered 2017-07-10: 0.5 mL via INTRAMUSCULAR
  Filled 2017-07-09: qty 0.5

## 2017-07-09 MED ORDER — ALUM & MAG HYDROXIDE-SIMETH 200-200-20 MG/5ML PO SUSP: 30.0000 mL | ORAL | Status: DC | PRN

## 2017-07-09 MED ORDER — LORAZEPAM 0.5 MG PO TABS
0.5000 mg | ORAL_TABLET | Freq: Four times a day (QID) | ORAL | Status: DC | PRN
  Administered 2017-07-09 – 2017-07-11 (×4): 0.5 mg via ORAL
  Filled 2017-07-09 (×4): qty 1

## 2017-07-09 NOTE — Progress Notes (Signed)
Patient ID: Dianah FieldMiriam T Jessie, female   DOB: 06/25/1970, 47 y.o.   MRN: [id number] Patient admitted to the unit with a complaint of anxiety.  Patient stated that while at home she became extremely anxious and took medications in hopes to relieve the anxiety but took to much.  Patient denies that she took medications in a suicide attempt and states that she was just trying to sleep.  Patient was tearful and tremulous throughout the admission and stated that she just wants to feel better.  Patient reports that her husband is incarcerated and since he has been in jail that she has had several issues with her neighbor and feels threatened and unsafe on her property. Patient states that she attempted to obtain a restraining order on her neighbor, but when the court date arrived she became increasingly anxious and was unable to go to court.   Skin assessment complete during admission process and patient found to be free of all injury an contraband.  Patient admitted to the unit without incident.

## 2017-07-09 NOTE — Plan of Care (Signed)
  Problem: Safety: Goal: Periods of time without injury will increase Outcome: Progressing Note:  Pt denies SI/HI and verbally contracts for safety.  She has not harmed self or others tonight.

## 2017-07-09 NOTE — Progress Notes (Signed)
Adult Psychoeducational Group Note  Date:  07/09/2017 Time:  8:54 PM  Group Topic/Focus:  Wrap-Up Group:   The focus of this group is to help patients review their daily goal of treatment and discuss progress on daily workbooks.  Participation Level:  Active  Participation Quality:  Appropriate  Affect:  Appropriate  Cognitive:  Alert  Insight: Appropriate  Engagement in Group:  Engaged  Modes of Intervention:  Discussion  Additional Comments:  Patient stated she was stressed out over her husband being in jail. Patient's goal is to go home.   Sritha Chauncey L Savannaha Stonerock 07/09/2017, 8:54 PM

## 2017-07-09 NOTE — BH Assessment (Signed)
Tele Assessment Note   Patient Name: Jenna FieldMiriam T Dillon MRN: [id number] Referring Physician: Bailey MechJason Phillips [facility] of [facility] Location of Provider: Behavioral Health TTS Department  Jenna FieldMiriam T Dillon is an 47 y.o. female [facility]. Pt has hx of anxiety and depression and reports she has recently felt very anxious. She says she has been stressed for several reasons, including husband going to jail, financial problems and a neighbor who is harassing her. She says this neighbor called animal control and today pt has to bury a dead animal that was on her property. She says she was very upset " brain addled" and ingested approximately 20 tabs of 81 mg aspirin and three and a half of 0.5 mg Xanax. Pt's 48 year old daughter noticed pt is distress and called 911. Triage note says pt was suicidal at the time but denies current SI. During assessment pt says she wasn't trying to kill herself and she was trying to rest. Pt reports she is very fearful of her neighbor and only sleeping 2-3 hours per night. She reports poor appetite due to excessive anxiety. She acknowledges symptoms including crying spells, social withdrawal, decreased concentration, fatigue and feelings of hopelessness. Pt denies homicidal thoughts or physical aggression. Pt denies having access to firearms. Pt denies having any legal problems at this time. Pt denies hallucinations. Pt does not appear to be responding to internal stimuli and exhibits no delusional thought. Pt's reality testing appears to be intact. Pt denies alcohol for substance use, however pt took Xanax not prescribed to her and UDS is positive for Benzodiazepines and cannabis. Pt reports she lives with her husband and daughter. She is unemployed. She reports she was psychiat[facility] and has seen a psychiatrist outpatient in the past. She denies any current mental health providers. She is adopted and does not know her  family's mental health hx. She says she does not want to be psychiatrically hospitalized.     Diagnosis: F32.2 Major depressive disorder, Single episode, Severe   Past Medical History:  Past Medical History:  Diagnosis Date  . 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  Marland Kitchen. GERD (gastroesophageal reflux disease) 01-24-12   no meds, controls with diet  . Obesity, Class III, BMI 40-49.9 (morbid obesity) (HCC) 06/14/2006   Qualifier: Diagnosis of  By: McDiarmid MD, Tawanna Coolerodd    . PANIC ATTACKS, History of 06/14/2006   Qualifier: History of  By: McDiarmid MD, Tawanna Coolerodd    . Pneumonia   . Shortness of breath 01-24-12   2'13-Pneumonia-has uses ProAir as needed for SOB.wheezing.    Past Surgical History:  Procedure Laterality Date  . ABDOMINAL HYSTERECTOMY  04/2007   Total abdominal hysterectomy, right salpingo-oophorectomy,  left salpingectomy for BENIGN ENDOMETRIOTIC CYST, 18.0 CM, WITH ADHESIONS.  Marland Kitchen. HERNIA REPAIR    . LAPAROSCOPIC CHOLECYSTECTOMY  ~1999  . LAPAROSCOPIC TUBAL LIGATION  2003   Dr Antionette CharLisa Jackson-Moore  . OOPHORECTOMY     Right salpingo-oophorectomy,    . VENTRAL HERNIA REPAIR  01/26/2012   Procedure: LAPAROSCOPIC VENTRAL HERNIA;  Surgeon: Ardeth SportsmanSteven C. Gross, MD;  Location: [facility] ORS;  Service: General;  Laterality: N/A;  Laparoscopic Incarcerated Ventral Hernia Repair x 4 with Mesh    Family History:  Family History  Problem Relation Age of Onset  . Heart disease Mother   . Cancer Father        lung     Social History:  reports that she has quit smoking. She quit smokeless tobacco use about 16 years ago. She reports that she does not drink alcohol or use drugs.  Additional Social History:  Alcohol / Drug Use Pain Medications: See MAR Prescriptions: See MAR Over the Counter: See MAR History of alcohol / drug use?: Yes Substance #1 Name of Substance 1: Benzodiazeoines 1 - Age of First Use: UKN 1 - Amount (size/oz): UKN 1 -  Frequency: UKN 1 - Duration: UKN 1 - Last Use / Amount: 07/08/17 Substance #2 Name of Substance 2: Cannabis 2 - Age of First Use: UKN 2 - Amount (size/oz): UKN 2 - Frequency: UKN 2 - Duration: UKN 2 - Last Use / Amount: 07/09/17  CIWA:   COWS:    Allergies:  Allergies  Allergen Reactions  . Sulfamethoxazole Rash    ictching    Home Medications:  Medications Prior to Admission  Medication Sig Dispense Refill  . ibuprofen (ADVIL,MOTRIN) 200 MG tablet Take 200 mg by mouth as needed. For pain      OB/GYN Status:  No LMP recorded. Patient has had a hysterectomy.  General Assessment Data Location of Assessment: [facility] TTS Assessment: Out of system Is this a Tele or Face-to-Face Assessment?: Tele Assessment Is this an Initial Assessment or a Re-assessment for this encounter?: Initial Assessment Marital status: Married Is patient pregnant?: No Pregnancy Status: No Living Arrangements: Spouse/significant other, Children Can pt return to current living arrangement?: Yes Admission Status: Involuntary Is patient capable of signing voluntary admission?: Yes Referral Source: Self/Family/Friend Insurance type: [facility]     Crisis Care Plan Living Arrangements: Spouse/significant other, Children Name of Psychiatrist: None Name of Therapist: None  Education Status Is patient currently in school?: No Is the patient employed, unemployed or receiving disability?: Unemployed  Risk to self with the past 6 months Suicidal Ideation: Yes-Currently Present Has patient been a risk to self within the past 6 months prior to admission? : No Suicidal Intent: Yes-Currently Present(Pt denies SI but ingested 20 aspirin and 3.5 zanax) Has patient had any suicidal intent within the past 6 months prior to admission? : No Is patient at risk for suicide?: Yes Suicidal Plan?: Yes-Currently Present(Overdose) Has patient had any suicidal plan within the past 6 months prior to  admission? : No Specify Current Suicidal Plan: Overdose Access to Means: Yes Specify Access to Suicidal Means: Medication What has been your use of drugs/alcohol within the last 12 months?: Benzos and Cannabis Family Suicide History: Unknown Recent stressful life event(s): Conflict (Comment), Job Loss, Financial Problems, Other (Comment)(Husband went to jail, conflict iwth neighbor) Persecutory voices/beliefs?: No Depression: Yes Depression Symptoms: Despondent, Feeling worthless/self pity Substance abuse history and/or treatment for substance abuse?: No Suicide prevention information given to non-admitted patients: Not applicable  Risk to Others within the past 6 months Homicidal Ideation: No Does patient have any lifetime risk of violence toward others beyond the six months prior to admission? : No Thoughts of Harm to Others: No Current Homicidal Intent: No Current Homicidal Plan: No Access to Homicidal Means: No History of harm to others?: No Assessment of Violence: None Noted Does patient have access to weapons?: No Criminal Charges Pending?: No Does patient have a court date: No Is patient on probation?: No  Psychosis Hallucinations: None noted Delusions: None noted  Mental Status Report Appearance/Hygiene: Unremarkable Eye Contact: Unable to Assess Motor Activity: Freedom of movement Speech: Logical/coherent Level of Consciousness: Alert Mood: Depressed Affect: Appropriate to circumstance Anxiety Level: Minimal Thought Processes:  Coherent, Relevant Judgement: Impaired Orientation: Person, Place, Time, Situation, Appropriate for developmental age Obsessive Compulsive Thoughts/Behaviors: None  Cognitive Functioning Concentration: Normal Memory: Recent Intact Is patient IDD: No Is patient DD?: No Insight: Poor Impulse Control: Poor Appetite: Good Have you had any weight changes? : No Change Sleep: No Change  ADLScreening [facility] Assessment Services) Patient's  cognitive ability adequate to safely complete daily activities?: Yes Patient able to express need for assistance with ADLs?: Yes Independently performs ADLs?: Yes (appropriate for developmental age)  Prior Inpatient Therapy Prior Inpatient Therapy: Yes Prior Therapy Dates: 1988 Prior Therapy Facilty/Provider(s): [facility]  Prior Outpatient Therapy Prior Outpatient Therapy: Yes Does patient have an ACCT team?: No Does patient have Intensive In-House Services?  : No Does patient have [facility] services? : No Does patient have P4CC services?: No  ADL Screening (condition at time of admission) Patient's cognitive ability adequate to safely complete daily activities?: Yes Is the patient deaf or have difficulty hearing?: No Does the patient have difficulty seeing, even when wearing glasses/contacts?: No Does the patient have difficulty concentrating, remembering, or making decisions?: No Patient able to express need for assistance with ADLs?: Yes Does the patient have difficulty dressing or bathing?: No Independently performs ADLs?: Yes (appropriate for developmental age) Does the patient have difficulty walking or climbing stairs?: No Weakness of Legs: None Weakness of Arms/Hands: None     Therapy Consults (therapy consults require a physician order) PT Evaluation Needed: No SLP Evaluation Needed: No Abuse/Neglect Assessment (Assessment to be complete while patient is alone) Abuse/Neglect Assessment Can Be Completed: Unable to assess, patient is non-responsive or altered mental status Values / Beliefs Cultural Requests During Hospitalization: None Spiritual Requests During Hospitalization: None Consults Spiritual Care Consult Needed: No Social Work Consult Needed: No      Additional Information 1:1 In Past 12 Months?: No CIRT Risk: No Elopement Risk: No Does patient have medical clearance?: Yes     Disposition:  Disposition Initial Assessment Completed for this  Encounter: Yes Disposition of Patient: Admit Type of inpatient treatment program: Adult  Per Nira Conn pt meets inpatient criteria. Pt accepted to [facility]  This service was provided via telemedicine using a 2-way, interactive audio and video technology.  Names of all persons participating in this telemedicine service and their role in this encounter. Name:  Role:  Name:  Role:  Name:  Role:  Name:  Role:     Danae Orleans, [place], LPCA 07/09/2017 1:49 PM

## 2017-07-09 NOTE — [facility] Group Notes (Signed)
[facility] Group Notes:  (Nursing/MHT/Case Management/Adjunct)  Date:  07/09/2017  Time:  1600  Type of Therapy:  Nurse Education  - Positive Self Talk to Promote Wellness  Participation Level:  Active  Participation Quality:  Attentive  Affect:  Blunted  Cognitive:  Alert  Insight:  Improving  Engagement in Group:  Engaged  Modes of Intervention:  Discussion, Education and Support  Summary of Progress/Problems: Patient attended group and participated actively in discussion.  Merian CapronFriedman, Talmadge [facility] 07/09/2017, 4:52 PM

## 2017-07-09 NOTE — Tx Team (Signed)
Initial Treatment Plan 07/09/2017 4:35 PM Jenna Dillon ZOX:[id number]    PATIENT STRESSORS: Financial difficulties   PATIENT STRENGTHS: Ability for insight Capable of independent living Communication skills   PATIENT IDENTIFIED PROBLEMS: "I am always anxious"                     DISCHARGE CRITERIA:  Improved stabilization in mood, thinking, and/or behavior  PRELIMINARY DISCHARGE PLAN: Return to previous living arrangement  PATIENT/FAMILY INVOLVEMENT: This treatment plan has been presented to and reviewed with the patient, Jenna FieldMiriam T Stoker, and/or family member.  The patient and family have been given the opportunity to ask questions and make suggestions.  Jerrye BushyLaRonica R Novah Nessel, RN 07/09/2017, 4:35 PM

## 2017-07-09 NOTE — Progress Notes (Signed)
D: Pt was in the hallway upon initial approach.  Pt presents with anxious affect and mood.  Pt states "I feel a little bit better than I did, I was under a lot of stress, my neighbor was harassing me."  Her goal is "to be able to sleep good."  She discussed the events leading to [facility] and she discussed how she hopes to "go home" soon.  Pt denies SI/HI, denies hallucinations.  Pt has been visible in milieu interacting with peers and staff appropriately.  Pt attended evening group.    A: Introduced self to pt.  Actively listened to pt and offered support and encouragement. Medications administered per order.  PRN medication administered for anxiety, pain, and sleep.  Q15 minute safety checks maintained.  R: Pt is safe on the unit.  Pt is compliant with medications.  She gradually de-escalated this evening and was smiling and discussing how she "made a friend" on the unit tonight.  She reports she is going to try to make the best of her time here and she hopes to leave in a few days.  Pt verbally contracts for safety.  Will continue to monitor and assess.

## 2017-07-10 DIAGNOSIS — F332 Major depressive disorder, recurrent severe without psychotic features: Secondary | ICD-10-CM

## 2017-07-10 DIAGNOSIS — R4587 Impulsiveness: Secondary | ICD-10-CM

## 2017-07-10 DIAGNOSIS — F17210 Nicotine dependence, cigarettes, uncomplicated: Secondary | ICD-10-CM

## 2017-07-10 DIAGNOSIS — F419 Anxiety disorder, unspecified: Secondary | ICD-10-CM

## 2017-07-10 DIAGNOSIS — Z6379 Other stressful life events affecting family and household: Secondary | ICD-10-CM

## 2017-07-10 MED ORDER — SERTRALINE HCL 50 MG PO TABS
50.0000 mg | ORAL_TABLET | Freq: Every day | ORAL | Status: DC
  Administered 2017-07-11 – 2017-07-12 (×2): 50 mg via ORAL
  Filled 2017-07-10 (×4): qty 1

## 2017-07-10 MED ORDER — SERTRALINE HCL 25 MG PO TABS
25.0000 mg | ORAL_TABLET | Freq: Once | ORAL | Status: AC
  Administered 2017-07-10: 25 mg via ORAL
  Filled 2017-07-10: qty 1

## 2017-07-10 NOTE — Progress Notes (Signed)
Pt presents with an animated affect and anxious mood. Pt noted to be fidgety and hyper verbal on approach. Pt reported that she's here in the hospital because she was having increased anxiety and panic attacks. Pt reported being harassed by her neighbor's and recently filed for a 50-B but was denied. Pt then began to feel unsafe in her home. Pt reported that her husband was placed in jail for a month which also caused some depression. Pt denies any active SI. Pt reported fair sleep last night. Pt denies any concerns for the MD this morning. Medications reviewed with pt. Medications administered as ordered per MD. Verbal support provided. Pt encouraged to attend groups. 15 minute checks performed for safety. Pt compliant with tx plan.

## 2017-07-10 NOTE — [facility] Group Notes (Signed)
Adult Psychoeducational Group Note  Date:  07/10/2017 Time:  9:26 PM  Group Topic/Focus:  Wrap-Up Group:   The focus of this group is to help patients review their daily goal of treatment and discuss progress on daily workbooks.  Participation Level:  Active  Participation Quality:  Appropriate  Affect:  Appropriate  Cognitive:  Alert and Oriented  Insight: Appropriate  Engagement in Group:  Developing/Improving  Modes of Intervention:  Exploration and Support  Additional Comments:  Pt verbalized that her goal is to get out of here. Pt verbalized that something positive is talking to the doctor and talking to her daughter. Pt verbalized that her child is keeping her positive and focus. Pt verbalized that tomorrow she wants to work on no having panic attacks and getting friends. Pt verbalized that two ways to decrease her anxiety is telling herself to calm down and having support.  Andreea Arca, Randal Bubaerri Lee 07/10/2017, 9:26 PM

## 2017-07-10 NOTE — H&P (Signed)
Psychiatric Admission Assessment Adult  Patient Identification: Jenna Dillon MRN:  [id number] Date of Evaluation:  07/10/2017 Chief Complaint:  MDD Principal Diagnosis: Major depression, generalized anxiety Diagnosis:   Patient Active Problem List   Diagnosis Date Noted  . MDD (major depressive disorder) [F32.9] 07/09/2017  . Pure hypercholesterolemia [E78.00] 12/23/2015  . Incisional hernias x 4 s/p lap VWH repairs w mesh 01/26/2012 [K43.2] 01/18/2012  . HOT FLASHES [N95.1] 11/29/2009  . Obesity (BMI 35.0-39.9 without comorbidity) [E66.9] 06/14/2006  . PANIC ATTACKS, History of [F41.0] 06/14/2006   History of Present Illness: Patient is seen and examined.  Patient is a 47 year old female with a past psychiatric history significant for depression and anxiety.  Case was discussed with Child psychotherapist as well as nursing this a.m. and treatment team.  The patient was transferred from [facility] after an evaluation there.  The patient stated that she had become significantly depressed and more anxious recently.  Her husband had been jailed because of lack of paying child support, and she had a neighbor who was harassing her.  She stated that the neighbor called animal control, and because problems for her.  She did become very upset, and ingested approximately 2081 mg aspirin tablets, and 3-1/2 0.5 mg Xanax tablets.  The patient's 49 year old daughter noticed the patient was in significant distress, called 911.  The patient stated she was not truly suicidal, but was just stressed out over everything.  She kept repeating "I have to do everything by myself".  The patient was significantly tearful as well as being fearful of her neighbor.  She was only sleeping 2-3 hours at night.  She poor appetite, crying spells, social withdrawal, decreased concentration, fatigue and feelings of helplessness and hopelessness.  She stated that in the past she had had an episode of excessive spending, but none in  many years.  Patient denied any alcohol for substance use, but did take Xanax that had not been prescribed for her, and her drug screen was positive for benzodiazepines as well as cannabis.  The patient stated she did not use benzodiazepines regularly, or smoke marijuana and stated that this was just because of all of her stress.  She admitted to a psychiatric hospitalization at [facility] in 1988.  She has been seen by a psychiatrist in the past as an outpatient, but nothing recently.  She was admitted to the hospital for evaluation and stabilization. Associated Signs/Symptoms: Depression Symptoms:  depressed mood, anhedonia, insomnia, psychomotor agitation, fatigue, feelings of worthlessness/guilt, difficulty concentrating, hopelessness, (Hypo) Manic Symptoms:  Distractibility, Impulsivity, Irritable Mood, Anxiety Symptoms:  Excessive Worry, Psychotic Symptoms:  None PTSD Symptoms: Negative Total Time spent with patient: 45 minutes  Past Psychiatric History: Patient admitted to a previous psychiatric hospitalization at [facility] in 1988.  She has not had outpatient treatment for some time.  She stated she had been treated in the past.  Is the patient at risk to self? Yes.    Has the patient been a risk to self in the past 6 months? No.  Has the patient been a risk to self within the distant past? Yes.    Is the patient a risk to others? No.  Has the patient been a risk to others in the past 6 months? No.  Has the patient been a risk to others within the distant past? No.   Prior Inpatient Therapy: Prior Inpatient Therapy: Yes Prior Therapy Dates: 1988 Prior Therapy Facilty/Provider(s): [facility] Prior Outpatient Therapy: Prior Outpatient  Therapy: Yes Does patient have an ACCT team?: No Does patient have Intensive In-House Services?  : No Does patient have [facility] services? : No Does patient have P4CC services?: No  Alcohol Screening: 1. How often do  you have a drink containing alcohol?: Never 2. How many drinks containing alcohol do you have on a typical day when you are drinking?: 1 or 2 3. How often do you have six or more drinks on one occasion?: Never AUDIT-C Score: 0 4. How often during the last year have you found that you were not able to stop drinking once you had started?: Never 5. How often during the last year have you failed to do what was normally expected from you becasue of drinking?: Never 6. How often during the last year have you needed a first drink in the morning to get yourself going after a heavy drinking session?: Never 7. How often during the last year have you had a feeling of guilt of remorse after drinking?: Never 8. How often during the last year have you been unable to remember what happened the night before because you had been drinking?: Never 9. Have you or someone else been injured as a result of your drinking?: No 10. Has a relative or friend or a doctor or another health worker been concerned about your drinking or suggested you cut down?: No Alcohol Use Disorder Identification Test Final Score (AUDIT): 0 Intervention/Follow-up: AUDIT Score <7 follow-up not indicated Substance Abuse History in the last 12 months:  Yes.   Consequences of Substance Abuse: Negative Previous Psychotropic Medications: Yes  Psychological Evaluations: Yes  Past Medical History:  Past Medical History:  Diagnosis Date  . 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  Marland Kitchen GERD (gastroesophageal reflux disease) 01-24-12   no meds, controls with diet  . Obesity, Class III, BMI 40-49.9 (morbid obesity) (HCC) 06/14/2006   Qualifier: Diagnosis of  By: McDiarmid MD, Tawanna Cooler    . PANIC ATTACKS, History of 06/14/2006   Qualifier: History of  By: McDiarmid MD, Tawanna Cooler    . Pneumonia   . Shortness of breath 01-24-12   2'13-Pneumonia-has uses ProAir as needed for SOB.wheezing.    Past  Surgical History:  Procedure Laterality Date  . ABDOMINAL HYSTERECTOMY  04/2007   Total abdominal hysterectomy, right salpingo-oophorectomy,  left salpingectomy for BENIGN ENDOMETRIOTIC CYST, 18.0 CM, WITH ADHESIONS.  Marland Kitchen HERNIA REPAIR    . LAPAROSCOPIC CHOLECYSTECTOMY  ~1999  . LAPAROSCOPIC TUBAL LIGATION  2003   Dr Antionette Char  . OOPHORECTOMY     Right salpingo-oophorectomy,    . VENTRAL HERNIA REPAIR  01/26/2012   Procedure: LAPAROSCOPIC VENTRAL HERNIA;  Surgeon: Ardeth Sportsman, MD;  Location: [facility];  Service: General;  Laterality: N/A;  Laparoscopic Incarcerated Ventral Hernia Repair x 4 with Mesh   Family History:  Family History  Problem Relation Age of Onset  . Heart disease Mother   . Cancer Father        lung    Family Psychiatric  History: She is adopted, but she knew that her father had been an alcoholic. Tobacco Screening: Have you used any form of tobacco in the last 30 days? (Cigarettes, Smokeless Tobacco, Cigars, and/or Pipes): Yes Tobacco use, Select all that apply: 4 or less cigarettes per day Are you interested in Tobacco Cessation Medications?: No, patient refused Counseled patient on smoking cessation including recognizing danger situations, developing coping skills and basic information about quitting provided:  Yes Social History:  Social History   Substance and Sexual Activity  Alcohol Use No     Social History   Substance and Sexual Activity  Drug Use No    Additional Social History: Marital status: Married Number of Years Married: 16 What types of issues is patient dealing with in the relationship?: Husband was on crack and was cheating on me, so I left him.  We were separated for 2 years, but then we reunited. that was 15 years ago Are you sexually active?: Yes What is your sexual orientation?: straight Does patient have children?: Yes How many children?: 1 How is patient's relationship with their children?: 48 YO daughter, has fragile x  syndrome, lives at home    Pain Medications: [facility] Prescriptions: See MAR Over the Counter: See MAR History of alcohol / drug use?: Yes Name of Substance 1: Benzodiazeoines 1 - Age of First Use: UKN 1 - Amount (size/oz): UKN 1 - Frequency: UKN 1 - Duration: UKN 1 - Last Use / Amount: 07/08/17 Name of Substance 2: Cannabis 2 - Age of First Use: UKN 2 - Amount (size/oz): UKN 2 - Frequency: UKN 2 - Duration: UKN 2 - Last Use / Amount: 07/09/17                Allergies:   Allergies  Allergen Reactions  . Sulfamethoxazole Rash    ictching   Lab Results: No results found for this or any previous visit (from the past 48 hour(s)).  Blood Alcohol level:  [facility] results found for: [facility]  Metabolic Disorder Labs:  No results found for: HGBA1C, MPG No results found for: PROLACTIN Lab Results  Component Value Date   CHOL 173 12/23/2015   TRIG 75 12/23/2015   HDL 58 12/23/2015   CHOLHDL 3.0 12/23/2015   VLDL 15 12/23/2015   LDLCALC 100 12/23/2015    Current Medications: Current Facility-Administered Medications  Medication Dose Route Frequency Provider Last Rate Last Dose  . acetaminophen (TYLENOL) tablet 650 mg  650 mg Oral Q6H PRN Thermon Leylandavis, Laura A, NP   650 mg at 07/10/17 [id number]  . alum & mag hydroxide-simeth (MAALOX/MYLANTA) 200-200-20 MG/5ML suspension 30 mL  30 mL Oral Q4H PRN Fransisca Kaufmannavis, Laura A, NP      . LORazepam (ATIVAN) tablet 0.5 mg  0.5 mg Oral Q6H PRN Cobos, Rockey SituFernando A, MD   0.5 mg at 07/10/17 1257  . magnesium hydroxide (MILK OF MAGNESIA) suspension 30 mL  30 mL Oral Daily PRN Thermon Leylandavis, Laura A, NP      . Melene Muller[START ON 07/11/2017] sertraline (ZOLOFT) tablet 50 mg  50 mg Oral Daily Antonieta Pertlary, Laina Guerrieri Lawson, MD      . traZODone (DESYREL) tablet 50 mg  50 mg Oral QHS PRN Thermon Leylandavis, Laura A, NP   50 mg at 07/09/17 2203   PTA Medications: Medications Prior to Admission  Medication Sig Dispense Refill Last Dose  . ibuprofen (ADVIL,MOTRIN) 200 MG tablet Take 200 mg by mouth as needed. For  pain   Taking    Musculoskeletal: Strength & Muscle Tone: within normal limits Gait & Station: normal Patient leans: N/A  Psychiatric Specialty Exam: Physical Exam  Constitutional: She appears well-developed and well-nourished.  HENT:  Head: Normocephalic.  Musculoskeletal: Normal range of motion.    ROS  Blood pressure 112/76, pulse 89, temperature 97.9 F (36.6 C), temperature source Oral, resp. rate 18, height 5\' 4"  (1.626 m), weight 104.8 kg (231 lb), SpO2 100 %.Body mass index is 39.65 kg/m.  General Appearance: Disheveled  Eye Contact:  Fair  Speech:  Clear and Coherent  Volume:  Increased  Mood:  Anxious  Affect:  Appropriate  Thought Process:  Coherent  Orientation:  Full (Time, Place, and Person)  Thought Content:  Negative  Suicidal Thoughts:  No  Homicidal Thoughts:  No  Memory:  Immediate;   Fair  Judgement:  Good  Insight:  Fair  Psychomotor Activity:  Increased  Concentration:  Concentration: Fair  Recall:  [employer] of Knowledge:  Fair  Language:  Good  Akathisia:  No  Handed:  Right  AIMS (if indicated):     Assets:  Communication Skills Desire for Improvement Housing Resilience  ADL's:  Intact  Cognition:  WNL  Sleep:  Number of Hours: 6    Treatment Plan Summary: Daily contact with patient to assess and evaluate symptoms and progress in treatment, Medication management and Plan Patient is seen and examined.  Patient is a 47 year old female with a past psychiatric history significant for major depression.  She developed suicidal ideation because of ongoing financial stressors, lack of social support because of her husband being in jail, and conflict with neighbors.  She recognized that she had been previously treated with Zoloft, and not had problems with that.  It been many years ago.  I am getting of her 25 mg of sertraline right now, and will start her on 50 mg a day starting tomorrow.  I have also written for hydroxyzine for anxiety for her.   She does have trazodone already for sleep.  We will get her to attend groups, and integrate her into the milieu of the unit.  We will attempt to collect some collateral information from her family members.  This is most probably going to be her daughter given that her husband is in jail.  The main thing we need to corroborate is the conflict with the neighbor, and that none of this would be associated with either manic or delusional thinking.  I have recommended to her no longer to take any Xanax from others, and this also includes marijuana.  She stated she did not do that on a regular basis, and that it was just to "calm my nerves".  Observation Level/Precautions:  15 minute checks  Laboratory:  CBC Chemistry Profile Folic Acid HbAIC UDS  Psychotherapy:    Medications:    Consultations:    Discharge Concerns:    Estimated LOS:  Other:     Physician Treatment Plan for Primary Diagnosis: <principal problem not specified> Long Term Goal(s): Improvement in symptoms so as ready for discharge  Short Term Goals: Ability to identify changes in lifestyle to reduce recurrence of condition will improve, Ability to verbalize feelings will improve, Ability to disclose and discuss suicidal ideas, Ability to demonstrate self-control will improve, Ability to identify and develop effective coping behaviors will improve, Ability to maintain clinical measurements within normal limits will improve and Compliance with prescribed medications will improve  Physician Treatment Plan for Secondary Diagnosis: Active Problems:   MDD (major depressive disorder)  Long Term Goal(s): Improvement in symptoms so as ready for discharge  Short Term Goals: Ability to identify changes in lifestyle to reduce recurrence of condition will improve, Ability to verbalize feelings will improve, Ability to disclose and discuss suicidal ideas, Ability to demonstrate self-control will improve, Ability to identify and develop effective  coping behaviors will improve and Ability to maintain clinical measurements within normal limits will improve  I certify that inpatient services  furnished can reasonably be expected to improve the patient's condition.    Antonieta Pert, MD 3/26/20196:04 PM

## 2017-07-10 NOTE — [facility] Group Notes (Signed)
  [facility] LCSW Group Therapy Note  Date/Time: 07/10/17, 1315  Type of Therapy/Topic:  Group Therapy:  Emotion Regulation  Participation Level:  Minimal   Mood:pleasant  Description of Group:    The purpose of this group is to assist patients in learning to regulate negative emotions and experience positive emotions. Patients will be guided to discuss ways in which they have been vulnerable to their negative emotions. These vulnerabilities will be juxtaposed with experiences of positive emotions or situations, and patients challenged to use positive emotions to combat negative ones. Special emphasis will be placed on coping with negative emotions in conflict situations, and patients will process healthy conflict resolution skills.  Therapeutic Goals: 1. Patient will identify two positive emotions or experiences to reflect on in order to balance out negative emotions:  2. Patient will label two or more emotions that they find the most difficult to experience:  3. Patient will be able to demonstrate positive conflict resolution skills through discussion or role plays:   Summary of Patient Progress:Pt shared that feeling overwhelmed is emotion that is difficult for her to experience.  Pt made several good comments during the group discussion regarding positive ways to handle difficult emotions.       Therapeutic Modalities:   Cognitive Behavioral Therapy Feelings Identification Dialectical Behavioral Therapy  Daleen SquibbGreg Clarke Amburn, LCSW

## 2017-07-10 NOTE — [facility] Suicide Risk Assessment (Signed)
[facility] INPATIENT:  Family/Significant Other Suicide Prevention Education  Suicide Prevention Education:  Contact Attempts: Charma IgoBritney Martin, step daughter, [phone] has been identified by the patient as the family member/significant other with whom the patient will be residing, and identified as the person(s) who will aid the patient in the event of a mental health crisis.  With written consent from the patient, two attempts were made to provide suicide prevention education, prior to and/or following the patient's discharge.  We were unsuccessful in providing suicide prevention education.  A suicide education pamphlet was given to the patient to share with family/significant other.  Date and time of first attempt:07/10/2017 2:33PM Date and time of second attempt:07/10/2017 4:11 PM   Jenna Dillon 07/10/2017, 4:09 PM

## 2017-07-10 NOTE — [facility] Group Notes (Signed)
Adult Psychoeducational Group Note  Date:  07/10/2017 Time:  9:09 AM  Group Topic/Focus:  Goals Group:   The focus of this group is to help patients establish daily goals to achieve during treatment and discuss how the patient can incorporate goal setting into their daily lives to aide in recovery.  Participation Level:  Active  Participation Quality:  Appropriate  Affect:  Appropriate  Cognitive:  Alert  Insight: Appropriate  Engagement in Group:  Engaged  Modes of Intervention:  Orientation  Additional Comments:  Pt was alert and engaged in group today. Pt goal for today is to not to think negative thoughts. Pt plans to focus on positive things.  Jenna Dillon 07/10/2017, 9:09 AM

## 2017-07-10 NOTE — Progress Notes (Signed)
Adult Psychoeducational Group Note  Date:  07/10/2017 Time:  1500 Group Topic/Focus:  Coping With Mental Health Crisis:   The purpose of this group is to help patients identify strategies for coping with mental health crisis.  Group discusses possible causes of crisis and ways to manage them effectively.  Participation Level:  Active  Participation Quality:  Appropriate  Affect:  Appropriate  Cognitive:  Appropriate  Insight: Appropriate  Engagement in Group:  Engaged  Modes of Intervention:  Discussion  Additional Comments:    Annamae Shivley L 07/10/2017 

## 2017-07-11 NOTE — Tx Team (Signed)
Interdisciplinary Treatment and Diagnostic Plan Update  07/11/2017 Time of Session: 9:30am Jenna Dillon MRN: [id number]  Principal Diagnosis: Major depression, generalized anxiety  Secondary Diagnoses: Active Problems:   MDD (major depressive disorder)   Current Medications:  Current Facility-Administered Medications  Medication Dose Route Frequency Provider Last Rate Last Dose  . acetaminophen (TYLENOL) tablet 650 mg  650 mg Oral Q6H PRN Thermon Leyland, NP   650 mg at 07/10/17 1856  . alum & mag hydroxide-simeth (MAALOX/MYLANTA) 200-200-20 MG/5ML suspension 30 mL  30 mL Oral Q4H PRN Fransisca Kaufmann A, NP      . LORazepam (ATIVAN) tablet 0.5 mg  0.5 mg Oral Q6H PRN Cobos, Rockey Situ, MD   0.5 mg at 07/11/17 0934  . magnesium hydroxide (MILK OF MAGNESIA) suspension 30 mL  30 mL Oral Daily PRN Fransisca Kaufmann A, NP      . sertraline (ZOLOFT) tablet 50 mg  50 mg Oral Daily Antonieta Pert, MD   50 mg at 07/11/17 0750  . traZODone (DESYREL) tablet 50 mg  50 mg Oral QHS PRN Thermon Leyland, NP   50 mg at 07/10/17 2230   PTA Medications: Medications Prior to Admission  Medication Sig Dispense Refill Last Dose  . ibuprofen (ADVIL,MOTRIN) 200 MG tablet Take 200 mg by mouth as needed. For pain   Taking    Patient Stressors: Financial difficulties  Patient Strengths: Ability for insight Capable of independent living Communication skills  Treatment Modalities: Medication Management, Group therapy, Case management,  1 to 1 session with clinician, Psychoeducation, Recreational therapy.   Physician Treatment Plan for Primary Diagnosis: <principal problem not specified> Long Term Goal(s): Improvement in symptoms so as ready for discharge Improvement in symptoms so as ready for discharge   Short Term Goals: Ability to identify changes in lifestyle to reduce recurrence of condition will improve Ability to verbalize feelings will improve Ability to disclose and discuss suicidal ideas Ability  to demonstrate self-control will improve Ability to identify and develop effective coping behaviors will improve Ability to maintain clinical measurements within normal limits will improve Compliance with prescribed medications will improve Ability to identify changes in lifestyle to reduce recurrence of condition will improve Ability to verbalize feelings will improve Ability to disclose and discuss suicidal ideas Ability to demonstrate self-control will improve Ability to identify and develop effective coping behaviors will improve Ability to maintain clinical measurements within normal limits will improve  Medication Management: Evaluate patient's response, side effects, and tolerance of medication regimen.  Therapeutic Interventions: 1 to 1 sessions, Unit Group sessions and Medication administration.  Evaluation of Outcomes: Progressing  Physician Treatment Plan for Secondary Diagnosis: Active Problems:   MDD (major depressive disorder)  Long Term Goal(s): Improvement in symptoms so as ready for discharge Improvement in symptoms so as ready for discharge   Short Term Goals: Ability to identify changes in lifestyle to reduce recurrence of condition will improve Ability to verbalize feelings will improve Ability to disclose and discuss suicidal ideas Ability to demonstrate self-control will improve Ability to identify and develop effective coping behaviors will improve Ability to maintain clinical measurements within normal limits will improve Compliance with prescribed medications will improve Ability to identify changes in lifestyle to reduce recurrence of condition will improve Ability to verbalize feelings will improve Ability to disclose and discuss suicidal ideas Ability to demonstrate self-control will improve Ability to identify and develop effective coping behaviors will improve Ability to maintain clinical measurements within normal limits will improve  Medication  Management: Evaluate patient's response, side effects, and tolerance of medication regimen.  Therapeutic Interventions: 1 to 1 sessions, Unit Group sessions and Medication administration.  Evaluation of Outcomes: Progressing   RN Treatment Plan for Primary Diagnosis: Major depression, generalized anxiety Long Term Goal(s): Knowledge of disease and therapeutic regimen to maintain health will improve  Short Term Goals: Ability to remain free from injury will improve, Ability to verbalize feelings will improve, Ability to disclose and discuss suicidal ideas and Compliance with prescribed medications will improve  Medication Management: RN will administer medications as ordered by provider, will assess and evaluate patient's response and provide education to patient for prescribed medication. RN will report any adverse and/or side effects to prescribing provider.  Therapeutic Interventions: 1 on 1 counseling sessions, Psychoeducation, Medication administration, Evaluate responses to treatment, Monitor vital signs and CBGs as ordered, Perform/monitor CIWA, COWS, AIMS and Fall Risk screenings as ordered, Perform wound care treatments as ordered.  Evaluation of Outcomes: Progressing   LCSW Treatment Plan for Primary Diagnosis: Major depression, generalized anxiety Long Term Goal(s): Safe transition to appropriate next level of care at discharge, Engage patient in therapeutic group addressing interpersonal concerns.  Short Term Goals: Engage patient in aftercare planning with referrals and resources, Increase social support, Increase emotional regulation and Increase skills for wellness and recovery  Therapeutic Interventions: Assess for all discharge needs, 1 to 1 time with Social worker, Explore available resources and support systems, Assess for adequacy in community support network, Educate family and significant other(s) on suicide prevention, Complete Psychosocial Assessment, Interpersonal  group therapy.  Evaluation of Outcomes: Progressing   Progress in Treatment: Attending groups: Yes. Participating in groups: Yes. Taking medication as prescribed: Yes. Toleration medication: Yes. Family/Significant other contact made: No, will contact:  patient's stepdaughter Patient understands diagnosis: Yes. Discussing patient identified problems/goals with staff: Yes. Medical problems stabilized or resolved: Yes. Denies suicidal/homicidal ideation: Yes. Issues/concerns per patient self-inventory: No. Other: Possible paranoid thinking regarding neighbor watching her and driving by slowly.   New problem(s) identified: No, Describe:  CSW continuing to assess  New Short Term/Long Term Goal(s): Patient's goal "get better and go home."  Discharge Plan or Barriers: Patient to discharge back to her own home.  Reason for Continuation of Hospitalization: Anxiety Delusions Depression Suicidal Ideation  Estimated Length of Stay: Continuing to assess  Attendees: Patient: Jenna Dillon 07/11/2017 2:59 PM  Physician: Marguerita Merlesr.Clary 07/11/2017 2:59 PM  Nursing: Rodell PernaPatrice, RN 07/11/2017 2:59 PM  RN Care Manager: 07/11/2017 2:59 PM  Social Worker: Garner NashGregory Wierda, CSW 07/11/2017 2:59 PM  Recreational Therapist:  07/11/2017 2:59 PM  Other: Enid Cutterharlotte Gregory Dowe, Social Work Intern 07/11/2017 2:59 PM  Other:  07/11/2017 2:59 PM  Other: 07/11/2017 2:59 PM    Scribe for Treatment Team: Darreld Mcleanharlotte C Masyn Rostro, Student-Social Work 07/11/2017 2:59 PM

## 2017-07-11 NOTE — Progress Notes (Signed)
[facility] MD Progress Note  07/11/2017 6:02 PM Jenna Dillon  MRN:  [id number] Subjective: Patient is seen and examined.  Patient is a 47 year old female with a past psychiatric history significant for major depression.  She seen in follow-up.  She was seen in treatment team today with nursing, social work and doctors.  She denied any suicidal ideation today.  She stated that she felt better.  She stated her sleep was better.  I felt like her speech was somewhat pressured, but she denied all symptoms of bipolar disorder.  She was still rather vague with regard to her marijuana use as well as the use of Xanax.  At times she would state that she does not use the marijuana but just occasionally, and then talked about the fact that when her husband was home and not in jail they would use it recreationally on the weekends.  She stated that the Xanax was only "3 bars".  She denied any side effects to her current medications.  She continues on sertraline, trazodone and as needed lorazepam. Principal Problem: <principal problem not specified> Diagnosis:   Patient Active Problem List   Diagnosis Date Noted  . MDD (major depressive disorder) [F32.9] 07/09/2017  . Pure hypercholesterolemia [E78.00] 12/23/2015  . Incisional hernias x 4 s/p lap VWH repairs w mesh 01/26/2012 [K43.2] 01/18/2012  . HOT FLASHES [N95.1] 11/29/2009  . Obesity (BMI 35.0-39.9 without comorbidity) [E66.9] 06/14/2006  . PANIC ATTACKS, History of [F41.0] 06/14/2006   Total Time spent with patient: 20 minutes  Past Psychiatric History: See admission H&P  Past Medical History:  Past Medical History:  Diagnosis Date  . 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  Marland Kitchen GERD (gastroesophageal reflux disease) 01-24-12   no meds, controls with diet  . Obesity, Class III, BMI 40-49.9 (morbid obesity) (HCC) 06/14/2006   Qualifier: Diagnosis of  By: McDiarmid MD, Tawanna Cooler    . PANIC ATTACKS,  History of 06/14/2006   Qualifier: History of  By: McDiarmid MD, Tawanna Cooler    . Pneumonia   . Shortness of breath 01-24-12   2'13-Pneumonia-has uses ProAir as needed for SOB.wheezing.    Past Surgical History:  Procedure Laterality Date  . ABDOMINAL HYSTERECTOMY  04/2007   Total abdominal hysterectomy, right salpingo-oophorectomy,  left salpingectomy for BENIGN ENDOMETRIOTIC CYST, 18.0 CM, WITH ADHESIONS.  Marland Kitchen HERNIA REPAIR    . LAPAROSCOPIC CHOLECYSTECTOMY  ~1999  . LAPAROSCOPIC TUBAL LIGATION  2003   Dr Antionette Char  . OOPHORECTOMY     Right salpingo-oophorectomy,    . VENTRAL HERNIA REPAIR  01/26/2012   Procedure: LAPAROSCOPIC VENTRAL HERNIA;  Surgeon: Ardeth Sportsman, MD;  Location: [facility];  Service: General;  Laterality: N/A;  Laparoscopic Incarcerated Ventral Hernia Repair x 4 with Mesh   Family History:  Family History  Problem Relation Age of Onset  . Heart disease Mother   . Cancer Father        lung    Family Psychiatric  History: See admission H&P  social History:  Social History   Substance and Sexual Activity  Alcohol Use No     Social History   Substance and Sexual Activity  Drug Use No    Social History   Socioeconomic History  . Marital status: Married    Spouse name: Not on file  . Number of children: Not on file  . Years of education: Not on file  . Highest education level: Not on file  Occupational  History  . Not on file  Social Needs  . Financial resource strain: Not on file  . Food insecurity:    Worry: Not on file    Inability: Not on file  . Transportation needs:    Medical: Not on file    Non-medical: Not on file  Tobacco Use  . Smoking status: Former Games developer  . Smokeless tobacco: Former Neurosurgeon    Quit date: 01/23/2001  Substance and Sexual Activity  . Alcohol use: No  . Drug use: No  . Sexual activity: Yes  Lifestyle  . Physical activity:    Days per week: Not on file    Minutes per session: Not on file  . Stress: Not on file   Relationships  . Social connections:    Talks on phone: Not on file    Gets together: Not on file    Attends religious service: Not on file    Active member of club or organization: Not on file    Attends meetings of clubs or organizations: Not on file    Relationship status: Not on file  Other Topics Concern  . Not on file  Social History Narrative  . Not on file   Additional Social History:    Pain Medications: See MAR Prescriptions: See MAR Over the Counter: See MAR History of alcohol / drug use?: Yes Name of Substance 1: Benzodiazeoines 1 - Age of First Use: UKN 1 - Amount (size/oz): UKN 1 - Frequency: UKN 1 - Duration: UKN 1 - Last Use / Amount: 07/08/17 Name of Substance 2: Cannabis 2 - Age of First Use: UKN 2 - Amount (size/oz): UKN 2 - Frequency: UKN 2 - Duration: UKN 2 - Last Use / Amount: 07/09/17                Sleep: Fair  Appetite:  Good  Current Medications: Current Facility-Administered Medications  Medication Dose Route Frequency Provider Last Rate Last Dose  . acetaminophen (TYLENOL) tablet 650 mg  650 mg Oral Q6H PRN Thermon Leyland, NP   650 mg at 07/10/17 1856  . alum & mag hydroxide-simeth (MAALOX/MYLANTA) 200-200-20 MG/5ML suspension 30 mL  30 mL Oral Q4H PRN Fransisca Kaufmann A, NP      . LORazepam (ATIVAN) tablet 0.5 mg  0.5 mg Oral Q6H PRN Cobos, Rockey Situ, MD   0.5 mg at 07/11/17 1619  . magnesium hydroxide (MILK OF MAGNESIA) suspension 30 mL  30 mL Oral Daily PRN Fransisca Kaufmann A, NP      . sertraline (ZOLOFT) tablet 50 mg  50 mg Oral Daily Antonieta Pert, MD   50 mg at 07/11/17 0750  . traZODone (DESYREL) tablet 50 mg  50 mg Oral QHS PRN Thermon Leyland, NP   50 mg at 07/10/17 2230    Lab Results: No results found for this or any previous visit (from the past 48 hour(s)).  Blood Alcohol level:  No results found for: [facility]  Metabolic Disorder Labs: No results found for: HGBA1C, MPG No results found for: PROLACTIN Lab Results   Component Value Date   CHOL 173 12/23/2015   TRIG 75 12/23/2015   HDL 58 12/23/2015   CHOLHDL 3.0 12/23/2015   VLDL 15 12/23/2015   LDLCALC 100 12/23/2015    Physical Findings: AIMS: Facial and Oral Movements Muscles of Facial Expression: None, normal Lips and Perioral Area: None, normal Jaw: None, normal Tongue: None, normal,Extremity Movements Upper (arms, wrists, hands, fingers): None, normal Lower (legs, knees,  ankles, toes): None, normal, Trunk Movements Neck, shoulders, hips: None, normal, Overall Severity Severity of abnormal movements (highest score from questions above): None, normal Incapacitation due to abnormal movements: None, normal Patient's awareness of abnormal movements (rate only patient's report): No Awareness, Dental Status Current problems with teeth and/or dentures?: No Does patient usually wear dentures?: No  CIWA:    COWS:     Musculoskeletal: Strength & Muscle Tone: within normal limits Gait & Station: normal Patient leans: N/A  Psychiatric Specialty Exam: Physical Exam  Nursing note and vitals reviewed. Constitutional: She is oriented to person, place, and time. She appears well-developed.  HENT:  Head: Normocephalic.  Neurological: She is alert and oriented to person, place, and time.    ROS  Blood pressure 110/76, pulse 96, temperature 98.1 F (36.7 C), temperature source Oral, resp. rate 18, height 5\' 4"  (1.626 m), weight 104.8 kg (231 lb), SpO2 100 %.Body mass index is 39.65 kg/m.  General Appearance: Fairly Groomed  Eye Contact:  Good  Speech:  Pressured  Volume:  Normal  Mood:  Euthymic  Affect:  Appropriate  Thought Process:  Coherent  Orientation:  Full (Time, Place, and Person)  Thought Content:  Negative  Suicidal Thoughts:  No  Homicidal Thoughts:  No  Memory:  Immediate;   Fair  Judgement:  Intact  Insight:  Fair  Psychomotor Activity:  Increased  Concentration:  Concentration: Fair  Recall:  Fair  Fund of Knowledge:   Good  Language:  Good  Akathisia:  No  Handed:  Right  AIMS (if indicated):     Assets:  Communication Skills Desire for Improvement Housing Social Support  ADL's:  Intact  Cognition:  WNL  Sleep:  Number of Hours: 6.5     Treatment Plan Summary: Daily contact with patient to assess and evaluate symptoms and progress in treatment, Medication management and Plan Patient is seen and examined.  Patient's 47 year old female with the above-stated past psychiatric history seen in follow-up.  She stated she is doing better, having no side effects, having no suicidal or homicidal ideation.  Social work spoke to her daughter today, and she corroborated her story.  We will plan to watch her over the next 24 hours, and hopefully be able to discharge her home at that time.  We will continue the 15-minute checks and monitor for suicidal ideation.  Antonieta PertGreg Lawson Shuntel Fishburn, MD 07/11/2017, 6:02 PM
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
## 2017-07-11 NOTE — B[facility] Suicide Risk Assessment (Signed)
[facility] Admission Suicide Risk Assessment   Nursing information obtained from:    Demographic factors:    Current Mental Status:    Loss Factors:    Historical Factors:    Risk Reduction Factors:     Total Time spent with patient: 45 minutes Principal Problem: <principal problem not specified> Diagnosis:   Patient Active Problem List   Diagnosis Date Noted  . MDD (major depressive disorder) [F32.9] 07/09/2017  . Pure hypercholesterolemia [E78.00] 12/23/2015  . Incisional hernias x 4 s/p lap VWH repairs w mesh 01/26/2012 [K43.2] 01/18/2012  . HOT FLASHES [N95.1] 11/29/2009  . Obesity (BMI 35.0-39.9 without comorbidity) [E66.9] 06/14/2006  . PANIC ATTACKS, History of [F41.0] 06/14/2006   Subjective Data: Patient is seen and examined.  Patient's 47 year old female with a past psychiatric history significant for depression and anxiety.  The patient admitted that she had increased recent psychosocial stressors and had suicidal thoughts.  She intentionally overdosed on aspirin and Xanax.  Her husband recently been in jail, and she felt very alone and unsupported.  She was admitted to the hospital for evaluation and stabilization.  Continued Clinical Symptoms:  Alcohol Use Disorder Identification Test Final Score (AUDIT): 0 The "Alcohol Use Disorders Identification Test", Guidelines for Use in Primary Care, Second Edition.  World Science writer [facility]). Score between 0-7:  no or low risk or alcohol related problems. Score between 8-15:  moderate risk of alcohol related problems. Score between 16-19:  high risk of alcohol related problems. Score 20 or above:  warrants further diagnostic evaluation for alcohol dependence and treatment.   CLINICAL FACTORS:   Depression:   Anhedonia Hopelessness Impulsivity   Musculoskeletal: Strength & Muscle Tone: within normal limits Gait & Station: normal Patient leans: N/A  Psychiatric Specialty Exam: Physical Exam  Constitutional: She is  oriented to person, place, and time. She appears well-developed and well-nourished.  Musculoskeletal: Normal range of motion.  Neurological: She is alert and oriented to person, place, and time.    ROS  Blood pressure 110/76, pulse 96, temperature 98.1 F (36.7 C), temperature source Oral, resp. rate 18, height 5\' 4"  (1.626 m), weight 104.8 kg (231 lb), SpO2 100 %.Body mass index is 39.65 kg/m.  General Appearance: Fairly Groomed  Eye Contact:  Good  Speech:  Clear and Coherent  Volume:  Normal  Mood:  Anxious  Affect:  Appropriate  Thought Process:  Coherent  Orientation:  Full (Time, Place, and Person)  Thought Content:  Logical  Suicidal Thoughts:  Yes.  without intent/plan  Homicidal Thoughts:  No  Memory:  Immediate;   Good  Judgement:  Fair  Insight:  Fair  Psychomotor Activity:  Increased  Concentration:  Concentration: Fair  Recall:  Fair  Fund of Knowledge:  Fair  Language:  Good  Akathisia:  No  Handed:  Right  AIMS (if indicated):     Assets:  Communication Skills Desire for Improvement Housing Resilience  ADL's:  Intact  Cognition:  WNL  Sleep:  Number of Hours: 6.5      COGNITIVE FEATURES THAT CONTRIBUTE TO RISK:  None    SUICIDE RISK:   Mild:  Suicidal ideation of limited frequency, intensity, duration, and specificity.  There are no identifiable plans, no associated intent, mild dysphoria and related symptoms, good self-control (both objective and subjective assessment), few other risk factors, and identifiable protective factors, including available and accessible social support.  PLAN OF CARE: Patient will be [facility].  Her  medications will be adjusted.  She will be integrated into the milieu.  She received psychosocial treatment during the course of hospitalization to assist in coping skills.  Collateral information will be collected from her family.  This will all be with the goal to  reduce suicidal thoughts and ideation.  I certify that inpatient services furnished can reasonably be expected to improve the patient's condition.   Antonieta PertGreg Lawson Clary, MD 07/11/2017, 7:28 AM

## 2017-07-11 NOTE — Progress Notes (Signed)
Recreation Therapy Notes  Date: 3.27.19 Time: 9:30 a.m. Location: [address] Dayroom   Group Topic: Stress Management   Goal Area(s) Addresses:  Goal 1.1: To reduce stress  -Patient will report feeling a reduction in stress level  -Patient will identify the importance of stress management  -Patient will participate during stress management group treatment     Intervention: Stress Management   Activity: Guided Imagery- Patients were in a peaceful environment with soft lighting enhancing patients mood. Patients were read a guided imagery script to help decrease stress levels   Education: Stress Management, Discharge Planning.    Education Outcome: Acknowledges edcuation/In group clarification offered/Needs additional education   Clinical Observations/Feedback:: Patient did not attend     Maxon Kresse, Recreation Therapy Intern   Jenna Dillon 07/11/2017 8:31 AM 

## 2017-07-11 NOTE — Progress Notes (Signed)
Adult Psychoeducational Group Note  Date:  07/11/2017 Time:  9:43 PM  Group Topic/Focus:  Wrap-Up Group:   The focus of this group is to help patients review their daily goal of treatment and discuss progress on daily workbooks.  Participation Level:  Active  Participation Quality:  Appropriate  Affect:  Appropriate  Cognitive:  Alert and Oriented  Insight: Good  Engagement in Group:  Engaged  Modes of Intervention:  Discussion  Additional Comments:  Pt attended group this evening. Pt rated her day a 9/10.  Leo GrosserMegan A Iyad Deroo 07/11/2017, 9:43 PM

## 2017-07-11 NOTE — Progress Notes (Signed)
Patient ID: Jenna Dillon, female   DOB: 04/25/1970, 47 y.o.   MRN: [id number]  D: Patient tearful earlier in shift, was stated that she was concerned because the provider had "made it feel as though my daughter will be taken from me".  Pt then elaborated that she had been smoking marijuana prior to this hospitalization, was stated that she does not smoke it in her home, and that she goes to a friend's house to smoke it where her daughter is not around.  Pt denies SI/HI/AVH, reports that sleep quality last night as good, appetite as good, nergy level as high, and rates her depression as 0 (10 being the highest level),her hopelessness level as 0 (10 being the worst), her anxiety level as 0 (10 being the highest level).  Pt complained of a panic attack twice, and was medicated with Ativan 0.5mg  at 0934 and at 1619.  Pt denies being in any physical pain.  A: Patient is being given all meds as ordered, empathy being provided as needed.  Pt also educated on all of her medications, and verbalized understanding.  Q15 minute safety checks in place.  R: Patient denies having any current concerns, will continue to monitor.

## 2017-07-11 NOTE — [facility] Suicide Risk Assessment (Signed)
[facility] INPATIENT:  Family/Significant Other Suicide Prevention Education  Suicide Prevention Education:  Education Completed; [place], step daughter, [phone], has been identified by the patient as the family member/significant other with whom the patient will be residing, and identified as the person(s) who will aid the patient in the event of a mental health crisis (suicidal ideations/suicide attempt).  With written consent from the patient, the family member/significant other has been provided the following suicide prevention education, prior to the and/or following the discharge of the patient.  The suicide prevention education provided includes the following:  Suicide risk factors  Suicide prevention and interventions  National Suicide Hotline telephone number  [facility] assessment telephone number  [facility] Emergency Assistance 911  [facility] and/or Residential Mobile Crisis Unit telephone number  Request made of family/significant other to:  Remove weapons (e.g., guns, rifles, knives), all items previously/currently identified as safety concern.  No guns in the home, per [place]any.  Remove drugs/medications (over-the-counter, prescriptions, illicit drugs), all items previously/currently identified as a safety concern.  The family member/significant other verbalizes understanding of the suicide prevention education information provided.  The family member/significant other agrees to remove the items of safety concern listed above.  [place]ittany reports that pt husband was put in jail for 30 days for failure to pay child support.  Pt has not been without him at all in a number of years and has fallen apart.  [facility] the week after he was locked up.  Pt left her daughter, Benetta SparVictoria, (Brittany's half sister) with the neighbors while she went to ED and they finally called [place]ittany and said pt was not doing well and they were worried about  [place]ia. [place]Brittany went and got [place], who is staying with her now.  G[place]Brittany also visited with the dad in jail and he wants [place]ia to stay with [place]ittany until he is released 07/28/17.  G[place]Brittany said that pt is having a different issue with another neighbor who has called animal control because he is upset about pt's 2 dogs.  G[place]Brittany has not noticed any paranoid behavior.  Pt is just unable to cope without husband being around.  Lorri FrederickWierda, Titania Gault Jon, LCSW 07/11/2017, 1:21 PM

## 2017-07-11 NOTE — Progress Notes (Signed)
D: When asked about her day pt stated, "I feel so much better than I did when I came in here". Stated, "I'm stress free and resting". Pt stated she's "only doing this for her daughter", however, when asked to clarify pt stated, she's "doing it for herself and her daughter". Pt has no questions or concerns.    A:  Support and encouragement was offered. 15 min checks continued for safety.  R: Pt remains safe.

## 2017-07-12 MED ORDER — SERTRALINE HCL 50 MG PO TABS: 50.0000 mg | ORAL_TABLET | Freq: Every day | ORAL | 0 refills | Status: DC

## 2017-07-12 MED ORDER — TRAZODONE HCL 50 MG PO TABS: 50.0000 mg | ORAL_TABLET | Freq: Every evening | ORAL | 0 refills | Status: DC | PRN

## 2017-07-12 NOTE — Progress Notes (Signed)
D Patient is prepared for her dc as she completes her daily assessment.On this assessment, she denied having SI today and she rated her depression, hopelessness and anxiety " 0/0/0/", respectively.   A Writer reviewed all dc instructions with her and she stated understanding. Pt is given cc of dc instructions ( SRA, AVS SSP and transiition record). ALl belongings are returned to pt.   R Pt is escorted to bldg entrance and dc'd per MD order.

## 2017-07-12 NOTE — Progress Notes (Signed)
D: Pt denies SI/HI/AVH. Pt is pleasant and cooperative. Pt stated she was ready to go. Pt endorsed  Multiple stressors affecting her before coming in, but stated she was happy about leaving.   A: Pt was offered support and encouragement. Pt was given scheduled medications. Pt was encourage to attend groups. Q 15 minute checks were done for safety.   R:Pt attends groups and interacts well with peers and staff. Pt is taking medication. Pt has no complaints.Pt receptive to treatment and safety maintained on unit.

## 2017-07-12 NOTE — [facility] Suicide Risk Assessment (Signed)
[facility]   Principal Problem: <principal problem not specified> Discharge Diagnoses:  Patient Active Problem List   Diagnosis Date Noted  . MDD (major depressive disorder) [F32.9] 07/09/2017  . Pure hypercholesterolemia [E78.00] 12/23/2015  . Incisional hernias x 4 s/p lap VWH repairs w mesh 01/26/2012 [K43.2] 01/18/2012  . HOT FLASHES [N95.1] 11/29/2009  . Obesity (BMI 35.0-39.9 without comorbidity) [E66.9] 06/14/2006  . PANIC ATTACKS, History of [F41.0] 06/14/2006    Total Time spent with patient: 30 minutes  Musculoskeletal: Strength & Muscle Tone: within normal limits Gait & Station: normal Patient leans: N/A  Psychiatric Specialty Exam: ROS  Blood pressure 113/72, pulse 95, temperature 98 F (36.7 C), temperature source Oral, resp. rate 16, height 5\' 4"  (1.626 m), weight 104.8 kg (231 lb), SpO2 100 %.Body mass index is 39.65 kg/m.  General Appearance: Casual  Eye Contact::  Good  Speech:  Clear and Coherent409  Volume:  Increased  Mood:  Euthymic  Affect:  Appropriate  Thought Process:  Coherent  Orientation:  Full (Time, Place, and Person)  Thought Content:  Negative  Suicidal Thoughts:  No  Homicidal Thoughts:  No  Memory:  Immediate;   Fair  Judgement:  Fair  Insight:  Fair  Psychomotor Activity:  Negative  Concentration:  Good  Recall:  Good  Fund of Knowledge:Good  Language: Good  Akathisia:  No  Handed:  Right  AIMS (if indicated):     Assets:  Communication Skills Desire for Improvement Housing Social Support  Sleep:  Number of Hours: 5.5  Cognition: WNL  ADL's:  Intact   Mental Status Per Nursing Assessment::   On Admission:     Demographic Factors:  Caucasian, Living alone and Unemployed  Loss Factors: NA  Historical Factors: Impulsivity  Risk Reduction Factors:   Sense of responsibility to family, Positive social support and Positive coping skills or problem solving skills  Continued Clinical Symptoms:   Depression:   Impulsivity  Cognitive Features That Contribute To Risk:  None    Suicide Risk:  Minimal: No identifiable suicidal ideation.  Patients presenting with no risk factors but with morbid ruminations; may be classified as minimal risk based on the severity of the depressive symptoms  Follow-up Information    Inc, [facility]. Go on 07/16/2017.   Why:  Please attend your follow up appt on Monday, 07/16/17, at 9:45am.  Please bring photo ID, social security card, insurance card, and proof of any household income. Contact information: [address] [place] [address] [phone]           Plan Of Care/Follow-up recommendations:  Activity:  ad lib  Antonieta PertGreg Lawson Lyrica Mcclarty, MD 07/12/2017, 9:12 AM

## 2017-07-12 NOTE — Progress Notes (Signed)
  [facility] :  Will you be returning to the same living situation after discharge:  Yes,  own home At discharge, do you have transportation home?: Yes,  step daughter Do you have the ability to pay for your medications: Yes,  medicaid  Release of information consent forms completed and in the chart;  Patient's signature needed at discharge.  Patient to Follow up at: Follow-up Information    [facility] Go on 07/16/2017.   Why:  Please attend your follow up appt on Monday, 07/16/17, at 9:45am.  Please bring photo ID, social security card, insurance card, and proof of any household income. Contact information: [address] [place] [address] [phone]           Next level of care provider has access to [facility] Link:no  Safety Planning and Suicide Prevention discussed: Yes,  with step daughter  Have you used any form of tobacco in the last 30 days? (Cigarettes, Smokeless Tobacco, Cigars, and/or Pipes): Yes  Has patient been referred to the Quitline?: Yes, faxed on 07/12/17  Patient has been referred for addiction treatment: Yes  Lorri FrederickWierda, Yuvaan Olander Jon, LCSW 07/12/2017, 11:19 AM

## 2017-07-12 NOTE — Plan of Care (Signed)
  Problem: Activity: Goal: Sleeping patterns will improve Outcome: Progressing Note:  Pt slept over 6 hrs   Problem: Safety: Goal: Periods of time without injury will increase Outcome: Progressing Note:  Pt safe on the unit at this time

## 2017-07-12 NOTE — Discharge Summary (Signed)
Physician Discharge Summary Note  Patient:  Jenna Dillon is an 47 y.o., female MRN:  [id number] DOB:  1970-12-20 Patient phone:  [phone] (home)  Patient address:   [address] [place] [address],  Total Time spent with patient: 20 minutes  Date of Admission:  07/09/2017 Date of Discharge: 07/13/17  Reason for Admission:  Worsening depression  Principal Problem: MDD (major depressive disorder) Discharge Diagnoses: Patient Active Problem List   Diagnosis Date Noted  . MDD (major depressive disorder) [F32.9] 07/09/2017  . Pure hypercholesterolemia [E78.00] 12/23/2015  . Incisional hernias x 4 s/p lap VWH repairs w mesh 01/26/2012 [K43.2] 01/18/2012  . HOT FLASHES [N95.1] 11/29/2009  . Obesity (BMI 35.0-39.9 without comorbidity) [E66.9] 06/14/2006  . PANIC ATTACKS, History of [F41.0] 06/14/2006    Past Psychiatric History: Patient admitted to a previous psychiatric hospitalization at [facility] in 1988.  She has not had outpatient treatment for some time.  She stated she had been treated in the past   Past Medical History:  Past Medical History:  Diagnosis Date  . 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  Marland Kitchen GERD (gastroesophageal reflux disease) 01-24-12   no meds, controls with diet  . Obesity, Class III, BMI 40-49.9 (morbid obesity) (HCC) 06/14/2006   Qualifier: Diagnosis of  By: McDiarmid MD, Tawanna Cooler    . PANIC ATTACKS, History of 06/14/2006   Qualifier: History of  By: McDiarmid MD, Tawanna Cooler    . Pneumonia   . Shortness of breath 01-24-12   2'13-Pneumonia-has uses ProAir as needed for SOB.wheezing.    Past Surgical History:  Procedure Laterality Date  . ABDOMINAL HYSTERECTOMY  04/2007   Total abdominal hysterectomy, right salpingo-oophorectomy,  left salpingectomy for BENIGN ENDOMETRIOTIC CYST, 18.0 CM, WITH ADHESIONS.  Marland Kitchen HERNIA REPAIR    . LAPAROSCOPIC CHOLECYSTECTOMY  ~1999  . LAPAROSCOPIC TUBAL  LIGATION  2003   Dr Antionette Char  . OOPHORECTOMY     Right salpingo-oophorectomy,    . VENTRAL HERNIA REPAIR  01/26/2012   Procedure: LAPAROSCOPIC VENTRAL HERNIA;  Surgeon: Ardeth Sportsman, MD;  Location: [facility];  Service: General;  Laterality: N/A;  Laparoscopic Incarcerated Ventral Hernia Repair x 4 with Mesh   Family History:  Family History  Problem Relation Age of Onset  . Heart disease Mother   . Cancer Father        lung    Family Psychiatric  History: She is adopted, but she knew that her father had been an alcoholic.   Social History:  Social History   Substance and Sexual Activity  Alcohol Use No     Social History   Substance and Sexual Activity  Drug Use No    Social History   Socioeconomic History  . Marital status: Married    Spouse name: Not on file  . Number of children: Not on file  . Years of education: Not on file  . Highest education level: Not on file  Occupational History  . Not on file  Social Needs  . Financial resource strain: Not on file  . Food insecurity:    Worry: Not on file    Inability: Not on file  . Transportation needs:    Medical: Not on file    Non-medical: Not on file  Tobacco Use  . Smoking status: Former Games developer  . Smokeless tobacco: Former Neurosurgeon    Quit date: 01/23/2001  Substance and Sexual Activity  . Alcohol use: No  .  Drug use: No  . Sexual activity: Yes  Lifestyle  . Physical activity:    Days per week: Not on file    Minutes per session: Not on file  . Stress: Not on file  Relationships  . Social connections:    Talks on phone: Not on file    Gets together: Not on file    Attends religious service: Not on file    Active member of club or organization: Not on file    Attends meetings of clubs or organizations: Not on file    Relationship status: Not on file  Other Topics Concern  . Not on file  Social History Narrative  . Not on file    Hospital Course:   07/10/17 [facility] MD Assessment: Patient is  seen and examined.  Patient is a 47 year old female with a past psychiatric history significant for depression and anxiety.  Case was discussed with Child psychotherapist as well as nursing this a.m. and treatment team.  The patient was transferred from [facility] after an evaluation there.  The patient stated that she had become significantly depressed and more anxious recently.  Her husband had been jailed because of lack of paying child support, and she had a neighbor who was harassing her.  She stated that the neighbor called animal control, and because problems for her.  She did become very upset, and ingested approximately 2081 mg aspirin tablets, and 3-1/2 0.5 mg Xanax tablets.  The patient's 64 year old daughter noticed the patient was in significant distress, called 911.  The patient stated she was not truly suicidal, but was just stressed out over everything.  She kept repeating "I have to do everything by myself".  The patient was significantly tearful as well as being fearful of her neighbor.  She was only sleeping 2-3 hours at night.  She poor appetite, crying spells, social withdrawal, decreased concentration, fatigue and feelings of helplessness and hopelessness.  She stated that in the past she had had an episode of excessive spending, but none in many years.  Patient denied any alcohol for substance use, but did take Xanax that had not been prescribed for her, and her drug screen was positive for benzodiazepines as well as cannabis.  The patient stated she did not use benzodiazepines regularly, or smoke marijuana and stated that this was just because of all of her stress.  [facility].  She has been seen by a psychiatrist in the past as an outpatient, but nothing recently.  She was admitted to the hospital for evaluation and stabilization.  Patient remained on the [facility] unit for 3 days and stabilized with medication and therapy.  Patient was started on Zoloft 50 mg Daily and Trazodone 50 mg QHS PRN. Patient showed improvement with improved mood, affect, sleep, appetite, and interaction. Patient has been seen in the day room interacting with peers and staff appropriately. She has been attending group and participating. She has denied any SI/HI/AVH and contracts for safety. She has agreed to follow up at [facility]. Patient has been provided with prescriptions for her medications upon discharge.    Physical Findings: AIMS: Facial and Oral Movements Muscles of Facial Expression: None, normal Lips and Perioral Area: None, normal Jaw: None, normal Tongue: None, normal,Extremity Movements Upper (arms, wrists, hands, fingers): None, normal Lower (legs, knees, ankles, toes): None, normal, Trunk Movements Neck, shoulders, hips: None, normal, Overall Severity Severity of abnormal movements (highest score from  questions above): None, normal Incapacitation due to abnormal movements: None, normal Patient's awareness of abnormal movements (rate only patient's report): No Awareness, Dental Status Current problems with teeth and/or dentures?: No Does patient usually wear dentures?: No  CIWA:    COWS:     Musculoskeletal: Strength & Muscle Tone: within normal limits Gait & Station: normal Patient leans: N/A  Psychiatric Specialty Exam: Physical Exam  Vitals reviewed. Constitutional: She is oriented to person, place, and time. She appears well-developed and well-nourished.  Cardiovascular: Normal rate.  Respiratory: Effort normal.  Musculoskeletal: Normal range of motion.  Neurological: She is alert and oriented to person, place, and time.  Skin: Skin is warm.    Review of Systems  Constitutional: Negative.   HENT: Negative.   Eyes: Negative.   Respiratory: Negative.   Cardiovascular: Negative.   Gastrointestinal: Negative.   Genitourinary: Negative.   Musculoskeletal: Negative.   Skin: Negative.    Neurological: Negative.   Endo/Heme/Allergies: Negative.   Psychiatric/Behavioral: Negative.     Blood pressure 113/72, pulse 95, temperature 98 F (36.7 C), temperature source Oral, resp. rate 16, height 5\' 4"  (1.626 m), weight 104.8 kg (231 lb), SpO2 100 %.Body mass index is 39.65 kg/m.  General Appearance: Casual  Eye Contact:  Good  Speech:  Clear and Coherent and Normal Rate  Volume:  Normal  Mood:  Euthymic  Affect:  Congruent  Thought Process:  Goal Directed and Descriptions of Associations: Intact  Orientation:  Full (Time, Place, and Person)  Thought Content:  WDL  Suicidal Thoughts:  No  Homicidal Thoughts:  No  Memory:  Immediate;   Good Recent;   Good Remote;   Good  Judgement:  Good  Insight:  Good  Psychomotor Activity:  Normal  Concentration:  Concentration: Good and Attention Span: Good  Recall:  Good  Fund of Knowledge:  Good  Language:  Good  Akathisia:  No  Handed:  Right  AIMS (if indicated):     Assets:  Communication Skills Desire for Improvement Financial Resources/Insurance Housing Physical Health Social Support Transportation  ADL's:  Intact  Cognition:  WNL  Sleep:  Number of Hours: 5.5     Have you used any form of tobacco in the last 30 days? (Cigarettes, Smokeless Tobacco, Cigars, and/or Pipes): Yes  Has this patient used any form of tobacco in the last 30 days? (Cigarettes, Smokeless Tobacco, Cigars, and/or Pipes) Yes, Yes, A prescription for an FDA-approved tobacco cessation medication was offered at discharge and the patient refused  Blood Alcohol level:  No results found for: [facility]  Metabolic Disorder Labs:  No results found for: HGBA1C, MPG No results found for: PROLACTIN Lab Results  Component Value Date   CHOL 173 12/23/2015   TRIG 75 12/23/2015   HDL 58 12/23/2015   CHOLHDL 3.0 12/23/2015   VLDL 15 12/23/2015   LDLCALC 100 12/23/2015    See Psychiatric Specialty Exam and Suicide Risk Assessment completed by Attending  Physician prior to discharge.  Discharge destination:  Home  Is patient on multiple antipsychotic therapies at discharge:  No   Has Patient had three or more failed trials of antipsychotic monotherapy by history:  No  Recommended Plan for Multiple Antipsychotic Therapies: NA   Allergies as of 07/12/2017      Reactions   Sulfamethoxazole Rash   ictching      Medication List    STOP taking these medications   ibuprofen 200 MG tablet Commonly known as:  ADVIL,MOTRIN  TAKE these medications     Indication  sertraline 50 MG tablet Commonly known as:  ZOLOFT Take 1 tablet (50 mg total) by mouth daily. For mood control  Indication:  mood stability   traZODone 50 MG tablet Commonly known as:  DESYREL Take 1 tablet (50 mg total) by mouth at bedtime as needed for sleep.  Indication:  Trouble Sleeping      [facility] Go on 07/16/2017.   Why:  Please attend your follow up appt on Monday, 07/16/17, at 9:45am.  Please bring photo ID, social security card, insurance card, and proof of any household income. Contact information: [address] [place] [address] [phone]           Follow-up recommendations:  Continue activity as tolerated. Continue diet as recommended by your PCP. Ensure to keep all appointments with outpatient providers.  Comments:  Patient is instructed prior to discharge to: Take all medications as prescribed by his/her mental healthcare provider. Report any adverse effects and or reactions from the medicines to his/her outpatient provider promptly. Patient has been instructed & cautioned: To not engage in alcohol and or illegal drug use while on prescription medicines. In the event of worsening symptoms, patient is instructed to call the crisis hotline, 911 and or go to the nearest ED for appropriate evaluation and treatment of symptoms. To follow-up with his/her primary care provider for your other medical  issues, concerns and or health care needs.    Signed: Gerlene Burdockravis B Naleah Kofoed, FNP 07/13/2017, 8:41 AM

## 2017-07-12 NOTE — [facility] Group Notes (Signed)
[facility] LCSW Group Therapy Note  Date/Time   07/12/17  1:15pm  Type of Therapy/Topic:  Group Therapy:  Balance in Life  Participation Level:  None- Did not attend  Description of Group:    This group will address the concept of balance and how it feels and looks when one is unbalanced. Patients will be encouraged to process areas in their lives that are out of balance, and identify reasons for remaining unbalanced. Facilitators will guide patients utilizing problem- solving interventions to address and correct the stressor making their life unbalanced. Understanding and applying boundaries will be explored and addressed for obtaining  and maintaining a balanced life. Patients will be encouraged to explore ways to assertively make their unbalanced needs known to significant others in their lives, using other group members and facilitator for support and feedback.  Therapeutic Goals: 1. Patient will identify two or more emotions or situations they have that consume much of in their lives. 2. Patient will identify signs/triggers that life has become out of balance:  3. Patient will identify two ways to set boundaries in order to achieve balance in their lives:  4. Patient will demonstrate ability to communicate their needs through discussion and/or role plays  Summary of Patient Progress Patient did not attend group.   Therapeutic Modalities:   Cognitive Behavioral Therapy Solution-Focused Therapy Assertiveness Training

## 2017-08-23 ENCOUNTER — Encounter: Payer: Self-pay | Admitting: Family Medicine

## 2017-08-24 NOTE — Progress Notes (Unsigned)
No show

## 2018-04-25 ENCOUNTER — Encounter: Payer: Self-pay | Admitting: Family Medicine

## 2018-04-25 ENCOUNTER — Ambulatory Visit (INDEPENDENT_AMBULATORY_CARE_PROVIDER_SITE_OTHER): Payer: Medicaid Other | Admitting: Family Medicine

## 2018-04-25 ENCOUNTER — Other Ambulatory Visit: Payer: Self-pay

## 2018-04-25 VITALS — BP 126/78 | HR 77 | Temp 98.1°F | Ht 62.0 in | Wt 254.0 lb

## 2018-04-25 DIAGNOSIS — F3342 Major depressive disorder, recurrent, in full remission: Secondary | ICD-10-CM | POA: Diagnosis present

## 2018-04-25 DIAGNOSIS — L309 Dermatitis, unspecified: Secondary | ICD-10-CM | POA: Diagnosis not present

## 2018-04-25 DIAGNOSIS — F410 Panic disorder [episodic paroxysmal anxiety] without agoraphobia: Secondary | ICD-10-CM | POA: Diagnosis not present

## 2018-04-25 DIAGNOSIS — Z23 Encounter for immunization: Secondary | ICD-10-CM | POA: Diagnosis not present

## 2018-04-25 DIAGNOSIS — L239 Allergic contact dermatitis, unspecified cause: Secondary | ICD-10-CM

## 2018-04-25 HISTORY — DX: Panic disorder (episodic paroxysmal anxiety): F41.0

## 2018-04-25 MED ORDER — TRIAMCINOLONE ACETONIDE 0.1 % EX CREA: 1.0000 "application " | TOPICAL_CREAM | Freq: Two times a day (BID) | CUTANEOUS | 1 refills | Status: DC

## 2018-04-25 MED ORDER — SERTRALINE HCL 50 MG PO TABS: 50.0000 mg | ORAL_TABLET | Freq: Every day | ORAL | 1 refills | Status: DC

## 2018-04-25 MED ORDER — CLONAZEPAM 0.5 MG PO TABS: 0.5000 mg | ORAL_TABLET | Freq: Two times a day (BID) | ORAL | 0 refills | Status: DC | PRN

## 2018-04-25 NOTE — Patient Instructions (Addendum)
Dr Alixis Harmon believes you have Panic Disorder.  The sertraline medicine will take 2 to 6 weeks to decrease the number of attacks and how strong the attacks are.    Use the clonazepam (like valium) if you start having a panic attack while you are waiting for the sertraline medicine to start working.    Plan on continuing the sertraline for at least 6 months.  We can talk about staying on it longer as 6 months approach.  Dr Treven Holtman wants to see you back in 6 weeks to see that the sertraline medicine is working well for you.  Sometimes the sertraline dose has to be increased to have the best effect on panic attacks.  We can talk about whether you should increase the dose at your next visit with Dr Ceniyah Thorp.  --------------------------------------------------------------------------------------------------------------------------------------------------------------------------------------------------------------------------------------------------------------------------------------------  Bonita Quin seem to be having an allergic reaction to something that is causing you itchy rash.   Rub in the triamcinolone steroid cream twice a day into the rash.  Do this for two weeks.  If the rash comes back after stopping the cream, then restart the cream for another two weeks.  Pick up Itch-X from the shelves at your pharmacy.  Rub the gel into your skin whenever you feel like scratching your skin.  It will numb your skin just enough to stop the itching for a couple hours.  Use it whenever the itch returns.  You may use the Itch-X as often as you need to to keep from scratching.   Panic Attack  A panic attack is when you suddenly feel very afraid, uncomfortable, or nervous (anxious). A panic attack can happen when you are scared or for no reason. A panic attack can feel like a serious problem. It can even feel like a heart attack or stroke. See your doctor when you have a panic attack to make sure you do not have a  serious problem. Follow these instructions at home:  Take medicines only as told by your doctor.  If you feel worried or nervous, try not to have caffeine.  Take good care of your health. To do this: ? Eat healthy. Make sure to eat fresh fruits and vegetables, whole grains, lean meats, and low-fat dairy. ? Get enough sleep. Try to sleep for 7-8 hours each night. ? Exercise. Try to be active for 30 minutes 5 or more days a week. ? Do not smoke. Talk to your doctor if you need help quitting. ? Limit how much alcohol you drink:  If you are a woman who is not pregnant: try not to have more than 1 drink a day.  If you are a man: try not to have more than 2 drinks a day.  One drink equals 12 oz of beer, 5 oz of wine, or 1 oz of hard liquor.  Keep all follow-up visits as told by your doctor. This is important. Contact a doctor if:  Your symptoms do not get better.  Your symptoms get worse.  You are not able to take your medicines as told. Get help right away if:  You have thoughts of hurting yourself or others.  You have symptoms of a panic attack. Do not drive yourself to the hospital. Have someone else drive you or call an ambulance. If you feel like you may hurt yourself or others, or have thoughts about taking your own life, get help right away. You can go to your nearest emergency department or call:  Your local emergency services (911  in the [place]  A suicide crisis helpline, such as the National Suicide Prevention Lifeline at 509-306-2744. This is open 24 hours a day. Summary  A panic attack is when you suddenly feel very afraid, uncomfortable, or nervous (anxious).  See your doctor when you have a panic attack to make sure that you do not have another serious problem.  If you feel like you may hurt yourself or others, get help right away by calling 911. This information is not intended to replace advice given to you by your health care provider. Make sure you discuss  any questions you have with your health care provider.

## 2018-04-26 LAB — TSH: TSH: 3.01 u[IU]/mL (ref 0.450–4.500)

## 2018-04-29 ENCOUNTER — Encounter: Payer: Self-pay | Admitting: Family Medicine

## 2018-04-29 NOTE — Assessment & Plan Note (Signed)
New problem Subacute pruritic blushing erythematous rash in a linear distribution along lefr arm and left lateral torso. Contact dermatitis Vs arthropod bites like Bedbugs.   Advised patient about possibility of bedbugs Tx with Triamcinolone crm 0-.1% with RF. Pramoxine OTC preparation for itch relief.  Continue Zyrtec for tiching.   Return if not resolved by next ov in 6 weeks.  RTC if worsens.

## 2018-04-29 NOTE — Progress Notes (Signed)
Subjective:    Patient ID: Jenna Dillon, female    DOB: [birth year], 48 y.o.   MRN: [id number] Jenna Dillon is accompanied by patient and husband Sources of clinical information for visit is/are patient, spouse/SO and past medical records. Nursing assessment for this office visit was reviewed with the patient for accuracy and revision.   Previous Report(s) Reviewed: historical medical records and recent ED visit notes  Depression screen PHQ 2/9 12/23/2015  Decreased Interest 0  Down, Depressed, Hopeless 0  PHQ - 2 Score 0     History/P.E. limitations: mental status  Adult vaccines due  Topic Date Due  . TETANUS/TDAP  12/22/2025   There are no preventive care reminders to display for this patient. There are no preventive care reminders to display for this patient.   CC: Anxiety, Rash   HPI History of Present Illness:   Anxiety Complaint Associated Signs/Symptoms: Depression Symptoms:  No Depressed meeod nor ahedonia  No (Hypo) Manic Symptoms:   Racing thoughts, Delusions, Decreased need for sleep and Suicidal ideation (if present, complete suicidal ideation treatment plan)  Anxiety Symptoms:   Episodes of  feelings of losing control, irritable, palpitations, panic attacks and shortness of breath Episodes occur out of the blue, like when watching TV Does worry about having another attack, expecially around others.  This problem is very difficult for her.  Interfering with her going out.  Continued fear of recurrence of attack is constant worry Dx by myself with Panic Disorder in 2008 treated with SSRI Admission to [facility] 1988 Admission [facility] 2013 overnight with possible panic attack.  She declined psychotropic medications at discharge [facility] Inpatient service for depression and anxiety after ingestion 20 tablets of 81 md tablet Aspirins and 3.5 tablets of Xanax 0.5 mg tablet.  Pt denied suicidal intent.   Dx: Major  Depressive Disorder. Tx recommendation Rx sertraline 50 mg daily and trazodone 50 mg qhs prn sleep.  Recommend F/U outpatient provider and [facility].  She did not continue on the sertraline nor trazodone nor fu with [facility] ("It is for addicts, I am not an addict"  While taking the sertraline at home, her Panic attacks decreased significantly.  She did not seek refills of sertraline when it ran out.   Psychotic Symptoms: None noted (Alteration in thought process) paranoia, hallucinations and delusions  PTSD Symptoms: Not reviewed with husband present   Total Time spent with patient: 25 Minutes    Past Psychiatric History:  Depressed and Prone to panicking.  has had no previous counseling.   Previous antidepressant med therapy: SSRI in 2008, Zoloft with trazodone at DC from [facility] inpt admission 06/2017 Previous anxiolytic med therapy: Klonopin 2008.  Prior antipsychotic med therapy: None   Is the patient at risk to self? no    Has the patient been a risk to self in the past 6 months? no    Has the patient been a risk to self within the distant past? yes     Is the patient a risk to others? no    Has the patient been a risk to others in the past 6 months? no    Has the patient been a risk to others within the distant past? no      Prior Inpatient Therapy: yes   Prior Therapy Dates: [facility] 1988, [facility] 2013, [facility] inpt 06/2017 Prior Therapy Facilty/Provider(s): Antonieta Pert, MD during [facility] acdmission  Prior Outpatient Therapy: yes, SSRI  Alcohol Screening:  CAGE questionnaire:   Felt the need to CUT DOWN drinking: No.. ANNOYED you by others criticizing drinking: No.. Felt GUILTY about drinking: No.. Drinks first-thing in the morning to steady nerves (EYE-OPENER): No..  1. How often do you have a drink containing alcohol?: daily 2. How many drinks containing alcohol do you have on a typical day when you are drinking?: 1 -2 drinks / day 3. How  often do you have six or more drinks on one occasion?: none   Additional Social History: Marital status: married Number of Years Married: 16 What types of issues is patient dealing with in the relationship?: economic problems, other psychosocial or environmental problems, problems related to legal system/crime and problems related to social environment  Are you sexually active?: yes What is your sexual orientation?: heterosexual Gender: Woman Does patient have children?: yes    How many children?: one How is patient's relationship with their children?: good.  Dgt has fragile X syndrome.  She is progressing through her IEP in high school.     Pain Medications: None History of alcohol / drug use?: Yes several times per week smoked marijuana   Substance Abuse History in the last 12 months:  no     Consequences of Substance Abuse: Negative  SH: Patient feels safe in current relationship (answered on paper intake form).  Drinks 1-2 drinks a month.  All CAGE questions negative.  (+) marijuana couple times a week.  She believes it decreases her anxiety. (+) smoking cigarettes 1-2 /d.  Not interested in quitting at this time.    RASH  Location: left arm and left trunck Onset: 2 weeks ago  Course: Seen in [place]  ED for rash that was described as BUE and Back for 1 month. Patient concerned that she may have been exposed to fleas and bedbugs at a friend's home.   03/26/18 given sample tube of triamcinolone with Rx for same.  Rx Zyrtec.  Dx: dermatitis Self-tx: [place] Improvement with treatment: Triamcinolone  History Pruritis: yes, very  Tenderness: no  New medications/antibiotics: no  Tick/insect/pet exposure: yes, see above  Recent travel: no  New detergent, new clothing, or other topical exposure: no   Red Flags Feeling ill: no  Fever: no  Mouth lesions: no  Facial/tongue swelling/difficulty breathing:  no  Diabetic or immunocompromised: no         Review of  Systems 11- point review of systems (paper) only positive for anxiety    Objective:   Physical Exam VS reviewed GEN: Alert, Cooperative, Groomed, NAD HEENT: PERRL; EAC bilaterally not occluded, TM's translucent with normal LM, (+) LR;                No cervical LAN, No thyromegaly, No palpable masses COR: RRR, No M/G/R, No JVD, Normal PMI size and location LUNGS: BCTA, No Acc mm use, speaking in full sentences EXT: No peripheral leg edema SKIN: erythematus, blushing macules in linear distribution along ulnar and posteromedial upper of left arm.  Pattern extends along lateral left flank of torso.  Ends at belt line.  No crusting nor scaling.   Neuro: Oriented to person, place, and time;  Gait: Normal speed, No significant path deviation, Step through +,  Psych: Euthymic/thought goal directed/speech fluent with prosody/language concrete and coherent    Assessment & Plan:

## 2018-04-29 NOTE — Assessment & Plan Note (Addendum)
Recurrent problem for patient since 2008 when dx by me.   [facility] that helped to significantly reduce the panic attacks. Panic attack frequency and intensity returned after she ran out of the sertraline.  Jenna Dillon is not interested in counseling at this time to address her panic disorder.   Recommendation - Start sertraline 50 mg daily for 6 months, then revisit its use - Start Klonopin 0.5 mg BID prn panic attacks over next two weeks while awaiting onset of action of sertraline.  - RTC ov 4-6 weeks to assess response and revisit counseling option.

## 2018-04-29 NOTE — Assessment & Plan Note (Signed)
Established problem that has improved.  In remission.

## 2018-05-23 ENCOUNTER — Ambulatory Visit: Payer: Medicaid Other | Admitting: Family Medicine

## 2018-06-06 ENCOUNTER — Ambulatory Visit: Payer: Medicaid Other | Admitting: Family Medicine

## 2018-06-27 ENCOUNTER — Ambulatory Visit: Payer: Medicaid Other | Admitting: Family Medicine

## 2018-10-14 ENCOUNTER — Other Ambulatory Visit: Payer: Self-pay | Admitting: Family Medicine

## 2018-10-14 DIAGNOSIS — F410 Panic disorder [episodic paroxysmal anxiety] without agoraphobia: Secondary | ICD-10-CM

## 2018-10-24 ENCOUNTER — Encounter: Payer: Self-pay | Admitting: Family Medicine

## 2018-10-24 ENCOUNTER — Ambulatory Visit: Payer: Medicaid Other | Admitting: Family Medicine

## 2018-11-14 ENCOUNTER — Other Ambulatory Visit: Payer: Self-pay | Admitting: Family Medicine

## 2018-11-14 DIAGNOSIS — L239 Allergic contact dermatitis, unspecified cause: Secondary | ICD-10-CM

## 2019-03-27 ENCOUNTER — Ambulatory Visit: Payer: Medicaid Other | Admitting: Family Medicine

## 2019-03-27 ENCOUNTER — Encounter: Payer: Self-pay | Admitting: Family Medicine

## 2019-03-27 ENCOUNTER — Other Ambulatory Visit: Payer: Self-pay

## 2019-03-27 VITALS — BP 102/60 | HR 80 | Wt 266.2 lb

## 2019-03-27 DIAGNOSIS — Z23 Encounter for immunization: Secondary | ICD-10-CM

## 2019-03-27 DIAGNOSIS — G4721 Circadian rhythm sleep disorder, delayed sleep phase type: Secondary | ICD-10-CM | POA: Diagnosis not present

## 2019-03-27 DIAGNOSIS — F5104 Psychophysiologic insomnia: Secondary | ICD-10-CM | POA: Diagnosis present

## 2019-03-27 MED ORDER — NORTRIPTYLINE HCL 25 MG PO CAPS: 25.0000 mg | ORAL_CAPSULE | Freq: Every day | ORAL | 1 refills | Status: DC

## 2019-03-27 NOTE — Patient Instructions (Signed)
It seems that the period when you go to sleep has gotten out of step from what is normal.  Start waking up from you normal sleep an hour earlier by setting an alarm.  Do this for a week.  This should cause you to start falling asleep in the morning an hour earlier.    Next, start setting the alarm to get up up from your morning sleep another hour earlier.  Do this for a week.  Again, this will move your falling asleep time another hour earlier.  Keep up this pattern of awakening an hour earlier each week, until you are going to bed at the time of night you want, like at [place].   Decrease your Sertraline to half a tablet every day.   Start a medicine to help you fall asleep called Nortriptyline - you can take one or two capsules at bedtime to help with sleep.    Let's meet by a telephone visit in about a month to talk about how these changes are working with your sleep.

## 2019-03-28 DIAGNOSIS — G4721 Circadian rhythm sleep disorder, delayed sleep phase type: Secondary | ICD-10-CM | POA: Insufficient documentation

## 2019-03-28 HISTORY — DX: Circadian rhythm sleep disorder, delayed sleep phase type: G47.21

## 2019-03-28 NOTE — Assessment & Plan Note (Signed)
Established problem worsened.  Jenna Dillon reports difficulty with falling asleep at night and fragmented sleep. Watches TV in bedroom Goes to bed about midnight, does not fall asleep until

## 2019-03-28 NOTE — Assessment & Plan Note (Addendum)
New problem Going to bed Midnight, initial major sleep phase 7am to noon, then around 5 or 6 pm to 8 pm. Wants to fall asleep at [place]. She is having periods of bladder urgency awakening her from sleep resulting in incontinence.  Jenna Dillon believes that her sertraline therapy is cause of this sleep problem.  She has had decrease in panic attacks on sertraline.   Plan: Gradual shifting backwards of initial major sleep phase awakenings by weekly 1-hour increments. Goal of shifting sleep initiation earlier and earlier until she sleeps from Midnight to 5 or 6 am.   Eventually, will need to late afternoon nap to consolidate sleep phase, if she wants a single sleep period.   Reduce sertraline to half-tablet (25 mg) daily.  Monitor for relapse to more frequent panic attacks.   Trial of Nortriptyline 25 mg caps, 1-2 caps (25 mg - 50 mg) at bedtimes prn Follow-up by virtual visit in a month.

## 2019-04-12 ENCOUNTER — Other Ambulatory Visit: Payer: Self-pay | Admitting: Family Medicine

## 2019-04-12 DIAGNOSIS — F410 Panic disorder [episodic paroxysmal anxiety] without agoraphobia: Secondary | ICD-10-CM

## 2019-07-30 ENCOUNTER — Other Ambulatory Visit: Payer: Self-pay | Admitting: Family Medicine

## 2019-07-30 DIAGNOSIS — L239 Allergic contact dermatitis, unspecified cause: Secondary | ICD-10-CM

## 2019-11-18 ENCOUNTER — Other Ambulatory Visit: Payer: Self-pay | Admitting: Family Medicine

## 2019-11-18 DIAGNOSIS — F410 Panic disorder [episodic paroxysmal anxiety] without agoraphobia: Secondary | ICD-10-CM

## 2020-02-26 ENCOUNTER — Ambulatory Visit: Payer: Medicaid Other | Admitting: Family Medicine

## 2020-04-29 ENCOUNTER — Other Ambulatory Visit: Payer: Self-pay

## 2020-04-29 ENCOUNTER — Ambulatory Visit (INDEPENDENT_AMBULATORY_CARE_PROVIDER_SITE_OTHER): Payer: Medicaid Other | Admitting: Family Medicine

## 2020-04-29 ENCOUNTER — Encounter: Payer: Self-pay | Admitting: Family Medicine

## 2020-04-29 VITALS — BP 140/80 | HR 78 | Ht 62.0 in | Wt 267.4 lb

## 2020-04-29 DIAGNOSIS — Z0000 Encounter for general adult medical examination without abnormal findings: Secondary | ICD-10-CM | POA: Diagnosis not present

## 2020-04-29 DIAGNOSIS — F3342 Major depressive disorder, recurrent, in full remission: Secondary | ICD-10-CM

## 2020-04-29 DIAGNOSIS — Z1159 Encounter for screening for other viral diseases: Secondary | ICD-10-CM

## 2020-04-29 DIAGNOSIS — R351 Nocturia: Secondary | ICD-10-CM

## 2020-04-29 DIAGNOSIS — F410 Panic disorder [episodic paroxysmal anxiety] without agoraphobia: Secondary | ICD-10-CM | POA: Diagnosis not present

## 2020-04-29 DIAGNOSIS — Z1211 Encounter for screening for malignant neoplasm of colon: Secondary | ICD-10-CM | POA: Diagnosis not present

## 2020-04-29 DIAGNOSIS — Z1231 Encounter for screening mammogram for malignant neoplasm of breast: Secondary | ICD-10-CM

## 2020-04-29 DIAGNOSIS — Z131 Encounter for screening for diabetes mellitus: Secondary | ICD-10-CM

## 2020-04-29 DIAGNOSIS — Z6841 Body Mass Index (BMI) 40.0 and over, adult: Secondary | ICD-10-CM

## 2020-04-29 DIAGNOSIS — Z23 Encounter for immunization: Secondary | ICD-10-CM | POA: Diagnosis not present

## 2020-04-29 DIAGNOSIS — Z13220 Encounter for screening for lipoid disorders: Secondary | ICD-10-CM

## 2020-04-29 DIAGNOSIS — Z87891 Personal history of nicotine dependence: Secondary | ICD-10-CM

## 2020-04-29 LAB — POCT URINALYSIS DIP (MANUAL ENTRY)
Bilirubin, UA: NEGATIVE
Blood, UA: NEGATIVE
Glucose, UA: NEGATIVE mg/dL
Ketones, POC UA: NEGATIVE mg/dL
Leukocytes, UA: NEGATIVE
Nitrite, UA: NEGATIVE
Protein Ur, POC: NEGATIVE mg/dL
Spec Grav, UA: >=1.03 — AB (ref 1.010–1.025)
Urobilinogen, UA: 0.2 E.U./dL
pH, UA: 5.5 (ref 5.0–8.0)

## 2020-04-29 LAB — POCT GLYCOSYLATED HEMOGLOBIN (HGB A1C): Hemoglobin A1C: 5.1 % (ref 4.0–5.6)

## 2020-04-29 MED ORDER — SERTRALINE HCL 50 MG PO TABS: 50.0000 mg | ORAL_TABLET | Freq: Every day | ORAL | 3 refills | Status: DC

## 2020-04-29 MED ORDER — SOLIFENACIN SUCCINATE 5 MG PO TABS: 5.0000 mg | ORAL_TABLET | Freq: Every day | ORAL | 1 refills | Status: DC

## 2020-04-29 NOTE — Progress Notes (Unsigned)
Asked by Dr. Perley Jain to educate patient on Trulicity  (dulglutide)  .  Patient educated on purpose, proper use and potential adverse effects of nausea/vomiting.  Following instruction patient verbalized understanding of treatment plan and was able to successfully administer first dose in office.  Patient is aware that this medication is dose once weekly.   She was educated that this medication may decrease the volume of her food intake by increasing satiety.

## 2020-04-29 NOTE — Patient Instructions (Addendum)
I believe you are having Urgency Incontinence.  It is when the bladder spasms and releases a lot of urine.   Try the medication Solifenacin 5 mg at bedtime.  It may take a few days to start working fully.   Avoid drinking anything for 4 hours before bedtime.   Dr Ventura Leggitt wants to perform a pelvic exam at your next visit to see if your bladder has dropped.   -------------------------------------------------------------------------------------------------------------------------------------------- Try Trulicity injection once a week and work on increasing the fresh fruits and vegetables in your diet.  At least one serving of each each day.  We will meet back in 4 weeks to see if Trulicity is helping you with weight loss.  -------------------------------------------------------------------------------------------------------------------------------------------- We have made referrals to [facility] for a cancer screening colonoscopy. They will contact you to set up a time to discuss the colonoscopy.  We have made a referral to [facility] for your Breast Cancer screening mammogram. They will contact you to set up a time for the mammogram.   If you have labs (blood work) drawn today and your tests are completely normal, you will receive your results only by:  MyChart Message (if you have MyChart) OR  A paper copy in the mail If you have any lab test that is abnormal or we need to change your treatment, we will call you to review the results. ------------------------------------------------------------------------------------------------------------------------------------------- We sent in a prescription to your pharmacy to get your Shingles vaccination.  Dr Onyekachi Gathright recommends you have the pharmacy give the vaccination in February.    Overactive Bladder, Adult  Overactive bladder is a condition in which a person has a sudden and frequent need to urinate. A person might  also leak urine if he or she cannot get to the bathroom fast enough (urinary incontinence). Sometimes, symptoms can interfere with work or social activities. What are the causes? Overactive bladder is associated with poor nerve signals between your bladder and your brain. Your bladder may get the signal to empty before it is full. You may also have very sensitive muscles that make your bladder squeeze too soon. This condition may also be caused by other factors, such as:  Medical conditions: ? Urinary tract infection. ? Infection of nearby tissues. ? Prostate enlargement. ? Bladder stones, inflammation, or tumors. ? Diabetes. ? Muscle or nerve weakness, especially from these conditions:  A spinal cord injury.  Stroke.  Multiple sclerosis.  Parkinson's disease.  Other causes: ? Surgery on the uterus or urethra. ? Drinking too much caffeine or alcohol. ? Certain medicines, especially those that eliminate extra fluid in the body (diuretics). ? Constipation. What increases the risk? You may be at greater risk for overactive bladder if you:  Are an older adult.  Smoke.  Are going through menopause.  Have prostate problems.  Have a neurological disease, such as stroke, dementia, Parkinson's disease, or multiple sclerosis (MS).  Eat or drink alcohol, spicy food, caffeine, and other things that irritate the bladder.  Are overweight or obese. What are the signs or symptoms? Symptoms of this condition include a sudden, strong urge to urinate. Other symptoms include:  Leaking urine.  Urinating 8 or more times a day.  Waking up to urinate 2 or more times overnight. How is this diagnosed? This condition may be diagnosed based on:  Your symptoms and medical history.  A physical exam.  Blood or urine tests to check for possible causes, such as infection. You may also need to see a health care provider  who specializes in urinary tract problems. This is called a  urologist. How is this treated? Treatment for overactive bladder depends on the cause of your condition and whether it is mild or severe. Treatment may include:  Bladder training, such as: ? Learning to control the urge to urinate by following a schedule to urinate at regular intervals. ? Doing Kegel exercises to strengthen the pelvic floor muscles that support your bladder.  Special devices, such as: ? Biofeedback. This uses sensors to help you become aware of your body's signals. ? Electrical stimulation. This uses electrodes placed inside the body (implanted) or outside the body. These electrodes send gentle pulses of electricity to strengthen the nerves or muscles that control the bladder. ? Women may use a plastic device, called a pessary, that fits into the vagina and supports the bladder.  Medicines, such as: ? Antibiotics to treat bladder infection. ? Antispasmodics to stop the bladder from releasing urine at the wrong time. ? Tricyclic antidepressants to relax bladder muscles. ? Injections of botulinum toxin type A directly into the bladder tissue to relax bladder muscles.  Surgery, such as: ? A device may be implanted to help manage the nerve signals that control urination. ? An electrode may be implanted to stimulate electrical signals in the bladder. ? A procedure may be done to change the shape of the bladder. This is done only in very severe cases. Follow these instructions at home: Eating and drinking  Make diet or lifestyle changes recommended by your health care provider. These may include: ? Drinking fluids throughout the day and not only with meals. ? Cutting down on caffeine or alcohol. ? Eating a healthy and balanced diet to prevent constipation. This may include:  Choosing foods that are high in fiber, such as beans, whole grains, and fresh fruits and vegetables.  Limiting foods that are high in fat and processed sugars, such as fried and sweet foods.    Lifestyle  Lose weight if needed.  Do not use any products that contain nicotine or tobacco. These include cigarettes, chewing tobacco, and vaping devices, such as e-cigarettes. If you need help quitting, ask your health care provider.   General instructions  Take over-the-counter and prescription medicines only as told by your health care provider.  If you were prescribed an antibiotic medicine, take it as told by your health care provider. Do not stop taking the antibiotic even if you start to feel better.  Use any implants or pessary as told by your health care provider.  If needed, wear pads to absorb urine leakage.  Keep a log to track how much and when you drink, and when you need to urinate. This will help your health care provider monitor your condition.  Keep all follow-up visits. This is important. Contact a health care provider if:  You have a fever or chills.  Your symptoms do not get better with treatment.  Your pain and discomfort get worse.  You have more frequent urges to urinate. Get help right away if:  You are not able to control your bladder. Summary  Overactive bladder refers to a condition in which a person has a sudden and frequent need to urinate.  Several conditions may lead to an overactive bladder.  Treatment for overactive bladder depends on the cause and severity of your condition.  Making lifestyle changes, doing Kegel exercises, keeping a log, and taking medicines can help with this condition. This information is not intended to replace advice given to  you by your health care provider. Make sure you discuss any questions you have with your health care provider. Document Revised: 12/22/2019 Document Reviewed: 12/22/2019 Elsevier Patient Education  2021 [employer].

## 2020-04-30 ENCOUNTER — Encounter: Payer: Self-pay | Admitting: Family Medicine

## 2020-04-30 DIAGNOSIS — R351 Nocturia: Secondary | ICD-10-CM | POA: Insufficient documentation

## 2020-04-30 DIAGNOSIS — Z87891 Personal history of nicotine dependence: Secondary | ICD-10-CM | POA: Insufficient documentation

## 2020-04-30 DIAGNOSIS — Z0000 Encounter for general adult medical examination without abnormal findings: Secondary | ICD-10-CM | POA: Insufficient documentation

## 2020-04-30 LAB — BASIC METABOLIC PANEL
BUN/Creatinine Ratio: 23 (ref 9–23)
BUN: 16 mg/dL (ref 6–24)
CO2: 24 mmol/L (ref 20–29)
Calcium: 8.9 mg/dL (ref 8.7–10.2)
Chloride: 102 mmol/L (ref 96–106)
Creatinine, Ser: 0.69 mg/dL (ref 0.57–1.00)
GFR calc Af Amer: 117 mL/min/{1.73_m2} (ref >59)
GFR calc non Af Amer: 102 mL/min/{1.73_m2} (ref >59)
Glucose: 82 mg/dL (ref 65–99)
Potassium: 4 mmol/L (ref 3.5–5.2)
Sodium: 141 mmol/L (ref 134–144)

## 2020-04-30 LAB — LIPID PANEL
Chol/HDL Ratio: 3.7 ratio (ref 0.0–4.4)
Cholesterol, Total: 170 mg/dL (ref 100–199)
HDL: 46 mg/dL (ref >39)
LDL Chol Calc (NIH): 99 mg/dL (ref 0–99)
Triglycerides: 144 mg/dL (ref 0–149)
VLDL Cholesterol Cal: 25 mg/dL (ref 5–40)

## 2020-04-30 LAB — HEPATITIS C ANTIBODY: Hep C Virus Ab: <0.1 s/co ratio (ref 0.0–0.9)

## 2020-04-30 NOTE — Assessment & Plan Note (Signed)
Quit in December after developing shingles and difficulty exercising on Nordic Track like exercise device.  Has not had a relapse.  Congratulated. Should there be a relapse, would recommend medication-assisted abstinence and PSV-23 vaccination.

## 2020-04-30 NOTE — Assessment & Plan Note (Signed)
New problem requiring further workup Urinary Incontinence Template Change daily activities because of urinary incontinence? Spontaneous, uncontrolled, large volume voiding awakening her at night.  Occurs two to three times a night about every other night.  How big a problem has urinary incontinence been to social activity in last month? None.  Does not occur during day  HPI General   Onset abrupt starting 6 months ago       Preciptants (Cough, sneeze, lifting, exercising):  NO  Sudden Urge to void yes, awakening her from sleep.  With movement towards BR she experiences spontaneous, large volume, uncontrollable void.  Red Flags (possible neurologic or neoplastic cause)   Sudden onset no    Hematuria no     Pelvic Pain no    Severe Straining no   Inability to void no Lower Tract symptoms          Frequency no    Nocturia yes             dysuria no   Other: Medical Conditions: Total abdominal hysterectomy, right salpingo-oophorectomy,  left salpingectomy for BENIGN ENDOMETRIOTIC CYST, Ventral hernia repair, Morbid obesity, SSRI therapy - No longer taking gabapentin.                   How distressing is the incontinence some   LABS Urinalysis with microscopy (at initial evaluation or worsening of symptoms)   Hematuria absent            Glycosuria absent             LE Neg            Nitr neg  Serum Creatinine  Lab Results  Component Value Date   CREATININE 0.69 04/29/2020    Serum Calcium and Glucose (polyuric) nl  Further testing   Classification of Urinary Incontinence Nocturia/Urgency Incontinence Non-pharmacologic Management . Weight loss (decrease incontinence with 8 lbs) . Pelvic Rehab - Declined bc of distance travel to [place].  Could look for pelvic rehab in [place]. Marland Kitchen Pelvic exam at next ov for consideration of vaginal pessaries   Pharmacologic Management . Antimuscarinics anticholinergics: Start solifenacin 5 mg qhs. RTC 4 weeks. ?question increase  dose.  Supportive Management . Pads and protective undergarments

## 2020-04-30 NOTE — Progress Notes (Signed)
Patient ID: Jenna Dillon, female   DOB: [birth year], 50 y.o.   MRN: [id number] LYNNIX SCHONEMAN is alone Sources of clinical information for visit is/are patient and [facility] ED visit note Nursing assessment for this office visit was reviewed with the patient for accuracy and revision.   Previous Report(s) Reviewed: ER records  Depression screen [facility] 2/9 04/29/2020  Decreased Interest 0  Down, Depressed, Hopeless 0  PHQ - 2 Score 0  Altered sleeping 2  Tired, decreased energy 2  Change in appetite 0  Feeling bad or failure about yourself  0  Trouble concentrating 0  Moving slowly or fidgety/restless 0  Suicidal thoughts 0  PHQ-9 Score 4  Difficult doing work/chores Somewhat difficult    Fall Risk  03/27/2019  Falls in the past year? 0    PHQ9 SCORE ONLY 04/29/2020 03/27/2019 03/27/2019  PHQ-9 Total Score 4 5 0    Adult vaccines due  Topic Date Due  . TETANUS/TDAP  12/22/2025    Health Maintenance Due  Topic Date Due  . COLONOSCOPY (Pts 45-74yrs Insurance coverage will need to be confirmed)  Never done  . MAMMOGRAM  Never done    History/P.E. limitations: none  Adult vaccines due  Topic Date Due  . TETANUS/TDAP  12/22/2025   There are no preventive care reminders to display for this patient.  Health Maintenance Due  Topic Date Due  . COLONOSCOPY (Pts 45-27yrs Insurance coverage will need to be confirmed)  Never done  . MAMMOGRAM  Never done     Chief Complaint  Patient presents with  . Obesity       . Panic Attack  . Anxiety  . Herpes Zoster  . Urinary Incontinence

## 2020-04-30 NOTE — Assessment & Plan Note (Signed)
Referral for screening colonoscopy to [facility] and to [facility] for screening mammography. Rx for Shingrix vaccination sent to her pharmacy.

## 2020-04-30 NOTE — Assessment & Plan Note (Signed)
Established problem. Stable. No signs of complications, medication side effects, or red flags. Continue current sertraline 50 mg daily .

## 2020-04-30 NOTE — Assessment & Plan Note (Signed)
Wt Readings from Last 3 Encounters:  04/29/20 267 lb 6 oz (121.3 kg)  03/27/19 266 lb 4 oz (120.8 kg)  04/25/18 254 lb (115.2 kg)   Lab Results  Component Value Date   HGBA1C 5.1 04/29/2020    Established problem worsened.  Progressive worsening No success with several attempts at various diet Weight is interfering with activity Not interested in formal nutrition counseling (bc does not want to travel to [place] from [place] We can address nutrition counseling in [facility]. At next ov in 4 weeks. Interested in medication assistance with weight loss and diet with increase in vegetables and fruits and decrease in starches.  Plan Start dulaglutide 0.75 mg injection per week.  Samples 4 weeks worth of med.  No history of medullary thyroid cancer, or pancreatitis. Pharmacy was kind enough to instruct Jenna Dillon in the use of the injector pen.  RTC in 4 weeks to assess tolerance and effectiveness.  If effective, will titrate and prescribe med.  Trulicity is on the [facility] Medicaid Preferred List. No inidcation.

## 2020-04-30 NOTE — Assessment & Plan Note (Signed)
Established problem. Stable. No signs of complications, medication side effects, or red flags. Continue sertraline 50 mg daily. Will continue for 1 year.  Refills provided.

## 2020-05-10 ENCOUNTER — Telehealth: Payer: Self-pay

## 2020-05-10 NOTE — Telephone Encounter (Signed)
Patient calls nurse line stating she was given 4 pens of Trulicity at recent office visit. Patient reports one was defective, therefore she will not have the 4th week. Patient is requesting another sample if possible. Please advise.

## 2020-05-10 NOTE — Telephone Encounter (Signed)
Please provide Jenna Dillon with an one Trulicity 0.75 mg per injection pen.

## 2020-05-11 NOTE — Telephone Encounter (Signed)
Attempted to contact patient (both home and mobile) and no answer.   Left message asking if patient preference would be to pick up additional med requested OR set-up a follow-up visit at the time when 3rd dose is due.   Patient was asked to call back, communicate preference.

## 2020-05-12 NOTE — Telephone Encounter (Signed)
Patient returned call.  We discussed her last Trulicity inhaler (replacement request for one that did not work).    Patient took a dose today and is scheduled for appointment in 8 days with Dr. Perley Jain.   She denies any side effects.  Following some discussion, we agreed that waiting for her appointment on 2/3 with Dr. McDiarmid would be acceptable and we could avoid her traveling to our clinic for a single sample pen.   On 2/3 we can determine if she has had significant weight reduction.  We can consider increasing dose of Trulicity at that time.  Currently we have both 0.75mg  and 1.5mg  Trulicity sample pens.   She verbalized understanding and agreement with the plan.  She plans to see Dr. McDiarmid on 2/3.

## 2020-05-12 NOTE — Telephone Encounter (Signed)
Noted and agree. 

## 2020-05-20 ENCOUNTER — Encounter: Payer: Self-pay | Admitting: Pharmacist

## 2020-05-20 ENCOUNTER — Ambulatory Visit: Payer: Medicaid Other | Admitting: Family Medicine

## 2020-05-20 NOTE — Progress Notes (Signed)
Patient arrives at front desk requesting sample of Trulicity 0.75mg .  Patient reports improved satiety (eating less) with use of Trulicity.  She reports only mild GI intolerance with 0.75mg  current dose.  Some diarrhea initially and only mild nausea.   Weight increased 4 pounds since last check.  267# increased to 271# today.  We discussed options and patient was comfortable with attempting dose increase.  As she appears to have overall good tolerability to this medication (she denies severe nausea or vomiting).   We agreed to increase the dose from 0.75mg  to 1.5mg  weekly.  Medication Samples have been provided to the patient.  Drug name: Trulicity (dulglutide)       Strength: 1.5mg         Qty: 1 box of 2 pens LOT: [id number]  Exp.Date: 02/12/2021  Dosing instructions: 1.5mg  once weekly (as her dose was due to Thursday.  She agreed to administer dose in office.  She demonstrated adequate technique with administration.   The patient has been instructed regarding the correct time, dose, and frequency of taking this medication, including desired effects and most common side effects.   Madelon Lips 3:23 PM 05/20/2020   Visit with Dr. McDiarmid scheduled for 05/27/2020 She did report some knee pain which we agreed should be discussed with Dr. McDiarmid next week.

## 2020-05-20 NOTE — Progress Notes (Signed)
Noted and agree. 

## 2020-05-27 ENCOUNTER — Ambulatory Visit: Payer: Medicaid Other | Admitting: Family Medicine

## 2020-06-16 ENCOUNTER — Telehealth: Payer: Self-pay | Admitting: Pharmacist

## 2020-06-16 MED ORDER — DULAGLUTIDE 1.5 MG/0.5ML ~~LOC~~ SOAJ: 1.5000 mg | SUBCUTANEOUS | 1 refills | Status: AC

## 2020-06-16 NOTE — Telephone Encounter (Signed)
Patient called and shared that she has run out of Trulicity (dulaglutide).  She reports taking her last dose of this medication 2 weeks ago.   She requested a prescription for this agent be sent to her pharmacy.  She missed her appointment due to her car breaking down AND states it is very hard for her to come to the clinic.  She reports that she believes Trulicity has helped with her satiety AND she also believes it has helped with weight loss.   I agreed to send a 1 month supply of Trulicity (dulaglutide) 1.5mg  to her pharmacy with an additional refill.   We agreed that she should plan to come to a visit with her PCP, Dr. McDiarmid in 1-2 months.

## 2020-06-17 NOTE — Telephone Encounter (Signed)
Noted and agree. 

## 2020-06-22 ENCOUNTER — Telehealth: Payer: Self-pay

## 2020-06-22 NOTE — Telephone Encounter (Signed)
Patient calls nurse line stating she has been unable to pick up Trulicity. Patient reports her insurance is requiring a PA. In order for Medicaid to pay the patient needs documented trail and failure of Metformin or a documented allergy to Metformin. I do not see where the patient has been on Metformin in the past, as she is not a diabetic. Regular medicaid will not pay for medication for obesity only. Will forward to PCP and pharmacy team.

## 2020-06-23 NOTE — Telephone Encounter (Signed)
Please schedule Jenna Dillon for a televisit to discuss problems

## 2020-06-24 NOTE — Telephone Encounter (Signed)
Good morning Dr. McDiarmid!  Unfortunately in this case patient will not qualify to be able to get started on GLP through Medicaid.

## 2020-06-24 NOTE — Telephone Encounter (Signed)
Attempted to reach pt. No answer. LVM for pt to call the office to make televisit with Dr.McDiarmid on his next open regular clinic  day. Aquilla Solian, CMA

## 2020-06-25 NOTE — Telephone Encounter (Signed)
Pt has appt with PCP on 3/17. Aquilla Solian, CMA

## 2020-07-01 ENCOUNTER — Telehealth (INDEPENDENT_AMBULATORY_CARE_PROVIDER_SITE_OTHER): Payer: Medicaid Other | Admitting: Family Medicine

## 2020-07-01 ENCOUNTER — Other Ambulatory Visit: Payer: Self-pay

## 2020-07-01 DIAGNOSIS — R351 Nocturia: Secondary | ICD-10-CM | POA: Diagnosis not present

## 2020-07-01 DIAGNOSIS — F410 Panic disorder [episodic paroxysmal anxiety] without agoraphobia: Secondary | ICD-10-CM | POA: Diagnosis not present

## 2020-07-01 DIAGNOSIS — Z6841 Body Mass Index (BMI) 40.0 and over, adult: Secondary | ICD-10-CM

## 2020-07-01 DIAGNOSIS — F3342 Major depressive disorder, recurrent, in full remission: Secondary | ICD-10-CM

## 2020-07-01 NOTE — Progress Notes (Signed)
Attempted to reach pt no answer. LVM reminding pt of televisit at 9. Aquilla Solian, CMA

## 2020-07-02 ENCOUNTER — Encounter: Payer: Self-pay | Admitting: Family Medicine

## 2020-07-02 NOTE — Assessment & Plan Note (Addendum)
Established problem. Stable. No signs of complications, medication side effects, or red flags. Continue current sertraline 50 mg daily for 1 year

## 2020-07-02 NOTE — Progress Notes (Signed)
[facility] Telemedicine / E- Visit   Ms Jenna Dillon had requested a videovisit appointment as she is unable to afford the cost of transportation to our office from [facility]  This format is felt to be most appropriate for this patient at this time.  All issues noted in this document were discussed and addressed. Patient's identity was confirmed using her Full Name and DOB.  We discussed the limitations of evaluation and management by telemedicine and the availability of in person appointments.  We discussed the limitations in assessment, security and privacy with performing an evaluation and management service by telephone and the availability of in person appointments.  We discussed with the patient that there may be a patient responsible charge related to this service.  The patient expressed understanding and agreed to proceed.   Encounter participants: Patient: Jenna Dillon  Patient Location: Home Provider: Tawanna Cooler McDiarmid at office  Others (if applicable): none  ========================================================================================================================================================================  Chief Complaint: Overweigh  HPI: Mood Disorder/Panic Disorder - Taking the Sertraline 50 mg daily - Stable mood and no panic events - pleased with her initial weight loss  Obesity, Morbid - Trial of dulaglutide starting 05/10/20 from samples. She had initial success but [employer] declined coverage bc pt did not have FDA indication for GLP-1 agonist.  Interestingly, her weight loss has continued since running out of the Dula glutide a couple weeks ago.    Assessment/Plan:  Morbid obesity with BMI of 45.0-49.9, adult (HCC) Established problem that has improved.  Mrs Jenna Dillon is unable to find someone to pick up samples of dulaglutide from our office.   We agreed that should she have someone coming up to [facility], she should let me  know.  We will have a 4 week supply of dulaglutide 1.5mg /0.5 ml pens available for pick up.    Given her persistent weight loss after the use of dulaglutide for 4 weeks, we will see if pulse dosing of 1.5 mg weekly for 4 wks followed by 4 weeks off, then repeat is successful for her.  She will have to depend on samples from [facility] for this therapy.   Patient's most immediate goal is weight less than 200 pounds.  We discussed what may be realistically expected, but she seems enthusiastic given her recent success.    MDD (major depressive disorder), recurrent, in full remission (HCC) Established problem. Stable. No signs of complications, medication side effects, or red flags. Continue current sertraline 50 mg daily for 1 year   Panic disorder Established problem. Stable. No signs of complications, medication side effects, or red flags. Continue sertraline 50 mg daily. Will continue for 1 year.  Refills provided.       Time spent on phone with patient: 30 minutes

## 2020-07-02 NOTE — Assessment & Plan Note (Signed)
Established problem. Stable. No signs of complications, medication side effects, or red flags. Continue sertraline 50 mg daily. Will continue for 1 year.  Refills provided.

## 2020-07-02 NOTE — Assessment & Plan Note (Signed)
Established problem that has improved.  Mrs Balbi is unable to find someone to pick up samples of dulaglutide from our office.   We agreed that should she have someone coming up to [facility], she should let me know.  We will have a 4 week supply of dulaglutide 1.5mg /0.5 ml pens available for pick up.    Given her persistent weight loss after the use of dulaglutide for 4 weeks, we will see if pulse dosing of 1.5 mg weekly for 4 wks followed by 4 weeks off, then repeat is successful for her.  She will have to depend on samples from [facility] for this therapy.   Patient's most immediate goal is weight less than 200 pounds.  We discussed what may be realistically expected, but she seems enthusiastic given her recent success.

## 2020-07-02 NOTE — Assessment & Plan Note (Signed)
Patient not taking vesicare

## 2020-10-19 ENCOUNTER — Ambulatory Visit: Payer: Medicaid Other

## 2021-03-01 ENCOUNTER — Other Ambulatory Visit: Payer: Self-pay | Admitting: Family Medicine

## 2021-03-01 DIAGNOSIS — L239 Allergic contact dermatitis, unspecified cause: Secondary | ICD-10-CM

## 2021-04-21 ENCOUNTER — Other Ambulatory Visit: Payer: Self-pay | Admitting: Family Medicine

## 2021-04-21 DIAGNOSIS — F410 Panic disorder [episodic paroxysmal anxiety] without agoraphobia: Secondary | ICD-10-CM

## 2021-04-21 DIAGNOSIS — F3342 Major depressive disorder, recurrent, in full remission: Secondary | ICD-10-CM

## 2021-06-29 ENCOUNTER — Other Ambulatory Visit: Payer: Self-pay | Admitting: Family Medicine

## 2021-06-29 DIAGNOSIS — F3342 Major depressive disorder, recurrent, in full remission: Secondary | ICD-10-CM

## 2021-06-29 DIAGNOSIS — F410 Panic disorder [episodic paroxysmal anxiety] without agoraphobia: Secondary | ICD-10-CM

## 2021-09-20 ENCOUNTER — Encounter: Payer: Self-pay | Admitting: *Deleted
